# Patient Record
Sex: Female | Born: 1955 | Race: White | Hispanic: Yes | Marital: Married | Smoking: Former smoker
Health system: Southern US, Community
[De-identification: ages and names within clinical notes are randomized; demographics above are authoritative.]

## PROBLEM LIST (undated history)

## (undated) ENCOUNTER — Ambulatory Visit (HOSPITAL_COMMUNITY): Admission: EM | Payer: No Typology Code available for payment source | Source: Home / Self Care

## (undated) DIAGNOSIS — E7800 Pure hypercholesterolemia, unspecified: Secondary | ICD-10-CM

## (undated) DIAGNOSIS — T7840XA Allergy, unspecified, initial encounter: Secondary | ICD-10-CM

## (undated) DIAGNOSIS — F329 Major depressive disorder, single episode, unspecified: Secondary | ICD-10-CM

## (undated) DIAGNOSIS — G4730 Sleep apnea, unspecified: Secondary | ICD-10-CM

## (undated) DIAGNOSIS — J302 Other seasonal allergic rhinitis: Secondary | ICD-10-CM

## (undated) DIAGNOSIS — F32A Depression, unspecified: Secondary | ICD-10-CM

## (undated) HISTORY — DX: Major depressive disorder, single episode, unspecified: F32.9

## (undated) HISTORY — PX: REDUCTION MAMMAPLASTY: SUR839

## (undated) HISTORY — DX: Allergy, unspecified, initial encounter: T78.40XA

## (undated) HISTORY — DX: Other seasonal allergic rhinitis: J30.2

## (undated) HISTORY — DX: Depression, unspecified: F32.A

## (undated) HISTORY — PX: OTHER SURGICAL HISTORY: SHX169

## (undated) HISTORY — DX: Pure hypercholesterolemia, unspecified: E78.00

## (undated) HISTORY — DX: Sleep apnea, unspecified: G47.30

---

## 2003-04-17 ENCOUNTER — Ambulatory Visit (HOSPITAL_COMMUNITY): Admission: RE | Admit: 2003-04-17 | Discharge: 2003-04-17 | Payer: Self-pay | Admitting: Internal Medicine

## 2003-06-25 ENCOUNTER — Emergency Department (HOSPITAL_COMMUNITY): Admission: AD | Admit: 2003-06-25 | Discharge: 2003-06-25 | Payer: Self-pay | Admitting: Emergency Medicine

## 2003-06-25 ENCOUNTER — Encounter: Payer: Self-pay | Admitting: Emergency Medicine

## 2004-08-26 ENCOUNTER — Ambulatory Visit: Payer: Self-pay | Admitting: Internal Medicine

## 2004-09-01 ENCOUNTER — Ambulatory Visit: Payer: Self-pay | Admitting: Internal Medicine

## 2004-09-02 ENCOUNTER — Encounter: Admission: RE | Admit: 2004-09-02 | Discharge: 2004-09-02 | Payer: Self-pay | Admitting: Internal Medicine

## 2004-09-28 ENCOUNTER — Ambulatory Visit: Payer: Self-pay | Admitting: Internal Medicine

## 2004-09-29 ENCOUNTER — Ambulatory Visit: Payer: Self-pay | Admitting: *Deleted

## 2004-10-06 ENCOUNTER — Encounter: Admission: RE | Admit: 2004-10-06 | Discharge: 2004-10-06 | Payer: Self-pay | Admitting: Family Medicine

## 2004-11-29 ENCOUNTER — Ambulatory Visit: Payer: Self-pay | Admitting: Internal Medicine

## 2004-12-01 ENCOUNTER — Ambulatory Visit (HOSPITAL_COMMUNITY): Admission: RE | Admit: 2004-12-01 | Discharge: 2004-12-01 | Payer: Self-pay | Admitting: Internal Medicine

## 2005-12-19 HISTORY — PX: HAND SURGERY: SHX662

## 2006-04-26 ENCOUNTER — Encounter: Admission: RE | Admit: 2006-04-26 | Discharge: 2006-04-26 | Payer: Self-pay | Admitting: Internal Medicine

## 2006-09-07 ENCOUNTER — Other Ambulatory Visit: Admission: RE | Admit: 2006-09-07 | Discharge: 2006-09-07 | Payer: Self-pay | Admitting: Obstetrics and Gynecology

## 2006-09-14 ENCOUNTER — Encounter: Admission: RE | Admit: 2006-09-14 | Discharge: 2006-09-14 | Payer: Self-pay | Admitting: Obstetrics and Gynecology

## 2007-03-05 ENCOUNTER — Ambulatory Visit: Payer: Self-pay | Admitting: Internal Medicine

## 2007-05-03 ENCOUNTER — Encounter: Admission: RE | Admit: 2007-05-03 | Discharge: 2007-05-03 | Payer: Self-pay | Admitting: Family Medicine

## 2007-05-07 ENCOUNTER — Ambulatory Visit: Payer: Self-pay | Admitting: Internal Medicine

## 2007-09-05 ENCOUNTER — Encounter (INDEPENDENT_AMBULATORY_CARE_PROVIDER_SITE_OTHER): Payer: Self-pay | Admitting: *Deleted

## 2007-09-06 ENCOUNTER — Ambulatory Visit: Payer: Self-pay | Admitting: Internal Medicine

## 2007-09-11 ENCOUNTER — Ambulatory Visit: Payer: Self-pay | Admitting: Internal Medicine

## 2007-09-18 ENCOUNTER — Ambulatory Visit: Payer: Self-pay | Admitting: Internal Medicine

## 2007-09-25 ENCOUNTER — Ambulatory Visit: Payer: Self-pay | Admitting: Family Medicine

## 2007-09-27 ENCOUNTER — Ambulatory Visit: Payer: Self-pay | Admitting: Internal Medicine

## 2007-09-27 LAB — CONVERTED CEMR LAB
Basophils Absolute: 0 10*3/uL (ref 0.0–0.1)
Basophils Relative: 0 % (ref 0–1)
Eosinophils Absolute: 0.1 10*3/uL (ref 0.0–0.7)
Eosinophils Relative: 2 % (ref 0–5)
HCT: 39.2 % (ref 36.0–46.0)
Hemoglobin: 12.6 g/dL (ref 12.0–15.0)
Lymphocytes Relative: 41 % (ref 12–46)
Lymphs Abs: 2.2 10*3/uL (ref 0.7–3.3)
MCHC: 32.1 g/dL (ref 30.0–36.0)
MCV: 92.5 fL (ref 78.0–100.0)
Monocytes Absolute: 0.4 10*3/uL (ref 0.2–0.7)
Monocytes Relative: 7 % (ref 3–11)
Neutro Abs: 2.6 10*3/uL (ref 1.7–7.7)
Neutrophils Relative %: 50 % (ref 43–77)
Platelets: 261 10*3/uL (ref 150–400)
RBC: 4.24 M/uL (ref 3.87–5.11)
RDW: 13.9 % (ref 11.5–14.0)
TSH: 0.824 microintl units/mL (ref 0.350–5.50)
WBC: 5.3 10*3/uL (ref 4.0–10.5)

## 2008-02-07 ENCOUNTER — Ambulatory Visit: Payer: Self-pay | Admitting: Internal Medicine

## 2008-03-12 ENCOUNTER — Encounter (INDEPENDENT_AMBULATORY_CARE_PROVIDER_SITE_OTHER): Payer: Self-pay | Admitting: Nurse Practitioner

## 2008-03-12 ENCOUNTER — Ambulatory Visit: Payer: Self-pay | Admitting: Internal Medicine

## 2008-03-17 ENCOUNTER — Ambulatory Visit: Payer: Self-pay | Admitting: Internal Medicine

## 2008-06-06 ENCOUNTER — Encounter: Admission: RE | Admit: 2008-06-06 | Discharge: 2008-06-06 | Payer: Self-pay | Admitting: Internal Medicine

## 2008-06-24 ENCOUNTER — Ambulatory Visit: Payer: Self-pay | Admitting: Internal Medicine

## 2008-10-14 ENCOUNTER — Ambulatory Visit: Payer: Self-pay | Admitting: Internal Medicine

## 2008-12-04 ENCOUNTER — Ambulatory Visit: Payer: Self-pay | Admitting: Internal Medicine

## 2009-04-27 ENCOUNTER — Ambulatory Visit: Payer: Self-pay | Admitting: Internal Medicine

## 2009-07-03 ENCOUNTER — Ambulatory Visit: Payer: Self-pay | Admitting: Internal Medicine

## 2009-07-14 ENCOUNTER — Ambulatory Visit: Payer: Self-pay | Admitting: Internal Medicine

## 2010-02-10 ENCOUNTER — Ambulatory Visit: Payer: Self-pay | Admitting: Internal Medicine

## 2010-02-10 LAB — CONVERTED CEMR LAB
CRP: 0.1 mg/dL (ref <0.6)
Hgb A1c MFr Bld: 5.7 % (ref 4.6–6.1)
TSH: 0.849 microintl units/mL (ref 0.350–4.500)

## 2010-05-05 ENCOUNTER — Ambulatory Visit: Payer: Self-pay | Admitting: Internal Medicine

## 2010-05-05 LAB — CONVERTED CEMR LAB
ALT: 26 units/L (ref 0–35)
AST: 23 units/L (ref 0–37)
Albumin: 4.3 g/dL (ref 3.5–5.2)
Alkaline Phosphatase: 58 units/L (ref 39–117)
BUN: 11 mg/dL (ref 6–23)
CO2: 23 meq/L (ref 19–32)
Calcium: 9 mg/dL (ref 8.4–10.5)
Chloride: 105 meq/L (ref 96–112)
Creatinine, Ser: 0.73 mg/dL (ref 0.40–1.20)
Glucose, Bld: 101 mg/dL — ABNORMAL HIGH (ref 70–99)
Potassium: 4.1 meq/L (ref 3.5–5.3)
Sodium: 137 meq/L (ref 135–145)
Total Bilirubin: 0.2 mg/dL — ABNORMAL LOW (ref 0.3–1.2)
Total CK: 222 units/L — ABNORMAL HIGH (ref 7–177)
Total Protein: 7.1 g/dL (ref 6.0–8.3)

## 2010-07-12 ENCOUNTER — Ambulatory Visit: Payer: Self-pay | Admitting: Internal Medicine

## 2010-08-02 ENCOUNTER — Ambulatory Visit: Payer: Self-pay | Admitting: Internal Medicine

## 2010-08-02 LAB — CONVERTED CEMR LAB
BUN: 16 mg/dL (ref 6–23)
CO2: 26 meq/L (ref 19–32)
Calcium: 9.8 mg/dL (ref 8.4–10.5)
Chloride: 106 meq/L (ref 96–112)
Creatinine, Ser: 0.79 mg/dL (ref 0.40–1.20)
Glucose, Bld: 96 mg/dL (ref 70–99)
Hgb A1c MFr Bld: 5.7 % — ABNORMAL HIGH (ref <5.7)
Potassium: 4.7 meq/L (ref 3.5–5.3)
Sodium: 138 meq/L (ref 135–145)

## 2010-08-24 ENCOUNTER — Emergency Department (HOSPITAL_COMMUNITY): Admission: EM | Admit: 2010-08-24 | Discharge: 2010-08-24 | Payer: Self-pay | Admitting: Family Medicine

## 2010-09-10 ENCOUNTER — Encounter: Admission: RE | Admit: 2010-09-10 | Discharge: 2010-09-10 | Payer: Self-pay | Admitting: Family Medicine

## 2010-09-22 ENCOUNTER — Encounter (INDEPENDENT_AMBULATORY_CARE_PROVIDER_SITE_OTHER): Payer: Self-pay | Admitting: Internal Medicine

## 2010-09-22 LAB — CONVERTED CEMR LAB
Cholesterol: 191 mg/dL (ref 0–200)
HDL: 43 mg/dL (ref >39)
LDL Cholesterol: 110 mg/dL — ABNORMAL HIGH (ref 0–99)
Total CHOL/HDL Ratio: 4.4
Triglycerides: 192 mg/dL — ABNORMAL HIGH (ref <150)
VLDL: 38 mg/dL (ref 0–40)

## 2010-09-23 ENCOUNTER — Ambulatory Visit (HOSPITAL_COMMUNITY): Admission: RE | Admit: 2010-09-23 | Discharge: 2010-09-23 | Payer: Self-pay | Admitting: Family Medicine

## 2010-11-17 ENCOUNTER — Emergency Department (HOSPITAL_COMMUNITY)
Admission: EM | Admit: 2010-11-17 | Discharge: 2010-11-17 | Payer: Self-pay | Source: Home / Self Care | Admitting: Family Medicine

## 2011-03-21 ENCOUNTER — Inpatient Hospital Stay (INDEPENDENT_AMBULATORY_CARE_PROVIDER_SITE_OTHER)
Admission: RE | Admit: 2011-03-21 | Discharge: 2011-03-21 | Disposition: A | Discharge status: Home / Self Care | Payer: Self-pay | Source: Ambulatory Visit | Attending: Emergency Medicine | Admitting: Emergency Medicine

## 2011-03-21 DIAGNOSIS — R51 Headache: Secondary | ICD-10-CM

## 2011-03-21 DIAGNOSIS — L0292 Furuncle, unspecified: Secondary | ICD-10-CM

## 2011-04-19 ENCOUNTER — Inpatient Hospital Stay (HOSPITAL_COMMUNITY): Admission: RE | Admit: 2011-04-19 | Payer: Medicaid Other | Source: Ambulatory Visit

## 2012-12-05 ENCOUNTER — Other Ambulatory Visit: Payer: Self-pay | Admitting: Family Medicine

## 2012-12-05 DIAGNOSIS — Z1231 Encounter for screening mammogram for malignant neoplasm of breast: Secondary | ICD-10-CM

## 2012-12-06 ENCOUNTER — Ambulatory Visit
Admission: RE | Admit: 2012-12-06 | Discharge: 2012-12-06 | Disposition: A | Discharge status: Home / Self Care | Payer: Medicare Other | Source: Ambulatory Visit | Attending: Family Medicine | Admitting: Family Medicine

## 2012-12-06 DIAGNOSIS — Z1231 Encounter for screening mammogram for malignant neoplasm of breast: Secondary | ICD-10-CM

## 2015-04-15 ENCOUNTER — Ambulatory Visit (INDEPENDENT_AMBULATORY_CARE_PROVIDER_SITE_OTHER): Payer: Medicare Other | Admitting: Neurology

## 2015-04-15 ENCOUNTER — Encounter: Payer: Self-pay | Admitting: Neurology

## 2015-04-15 VITALS — BP 117/74 | HR 63 | Temp 97.3°F | Ht 61.0 in | Wt 214.0 lb

## 2015-04-15 DIAGNOSIS — R519 Headache, unspecified: Secondary | ICD-10-CM | POA: Insufficient documentation

## 2015-04-15 DIAGNOSIS — E662 Morbid (severe) obesity with alveolar hypoventilation: Secondary | ICD-10-CM | POA: Diagnosis not present

## 2015-04-15 DIAGNOSIS — R0683 Snoring: Secondary | ICD-10-CM | POA: Diagnosis not present

## 2015-04-15 DIAGNOSIS — R0681 Apnea, not elsewhere classified: Secondary | ICD-10-CM | POA: Diagnosis not present

## 2015-04-15 DIAGNOSIS — G4719 Other hypersomnia: Secondary | ICD-10-CM | POA: Diagnosis not present

## 2015-04-15 DIAGNOSIS — R51 Headache with orthostatic component, not elsewhere classified: Secondary | ICD-10-CM

## 2015-04-15 NOTE — Progress Notes (Signed)
[facility]    Provider:  Dr Jaynee Eagles Referring Provider: Harvie Junior, MD Primary Care Physician:  No primary care provider on file.  CC:  Headache  HPI:  Megan Mcintyre is a 59 y.o. female here as a referral from Dr. Jimmye Norman for headache   She has headaches, since childhood. The headache is pressure all over the head. Sometimes more on the right side. No nausea, no vomiting. No migrainous symptoms. She snores, she stops breathing in the middle of the night. She has gained 50 pounds. Excessively tired during the day. Falling asleep during the day, no energy. Morning headaches. No changes in vision or hearing. She has a cataract in the right eye. Has some dizziness. Headaches 2-3 days a week. Get 10/10 pain. Takes tylenol, takes advil. Morning headaches. Worsening headaches, can last for days. Can be 10/10 pain. No othe focal neurologic symptoms associated.   Review of Systems: Patient complains of symptoms per HPI as well as the following symptoms: No SOB, No CP, fatigue, snoring, not enough sleep Pertinent negatives per HPI. All others negative.   History   Social History  . Marital Status: Single    Spouse Name: N/A  . Number of Children: 3  . Years of Education: 12   Occupational History  . Unemployed    Social History Main Topics  . Smoking status: Former Research scientist (life sciences)  . Smokeless tobacco: Not on file  . Alcohol Use: No  . Drug Use: No  . Sexual Activity: Not on file   Other Topics Concern  . Not on file   Social History Narrative   Lives at home by herself.      Caffeine: none    Family History  Problem Relation Age of Onset  . Migraines Neg Hx     Past Medical History  Diagnosis Date  . High cholesterol   . Seasonal allergies     Past Surgical History  Procedure Laterality Date  . Hand surgery Bilateral 2007    Carpal tunnel     Current Outpatient Prescriptions  Medication Sig Dispense Refill  . lovastatin (MEVACOR) 10 MG  tablet Take 10 mg by mouth daily.     No current facility-administered medications for this visit.    Allergies as of 04/15/2015  . (No Known Allergies)    Vitals: BP 117/74 mmHg  Pulse 63  Temp(Src) 97.3 F (36.3 C)  Ht 5\' 1"  (1.549 m)  Wt 214 lb (97.07 kg)  BMI 40.46 kg/m2 Last Weight:  Wt Readings from Last 1 Encounters:  04/15/15 214 lb (97.07 kg)   Last Height:   Ht Readings from Last 1 Encounters:  04/15/15 5\' 1"  (1.549 m)   Physical exam: Exam: Gen: NAD, conversant, well nourised, obese, well groomed                     CV: RRR, no MRG. No Carotid Bruits. No peripheral edema, warm, nontender Eyes: Conjunctivae clear without exudates or hemorrhage  Neuro: Detailed Neurologic Exam  Speech:    Speech is normal; fluent and spontaneous with normal comprehension.  Cognition:    The patient is oriented to person, place, and time;     recent and remote memory intact;     language fluent;     normal attention, concentration,     fund of knowledge Cranial Nerves:    The pupils are equal, round, and reactive to light. The fundi are normal and spontaneous venous pulsations are present. Visual  fields are full to finger confrontation. Extraocular movements are intact. Trigeminal sensation is intact and the muscles of mastication are normal. The face is symmetric. The palate elevates in the midline. Hearing intact. Voice is normal. Shoulder shrug is normal. The tongue has normal motion without fasciculations.   Coordination:    Normal finger to nose and heel to shin. Normal rapid alternating movements.   Gait:    Heel-toe and tandem gait are normal.   Motor Observation:    No asymmetry, no atrophy, and no involuntary movements noted. Tone:    Normal muscle tone.    Posture:    Posture is normal. normal erect    Strength:    Strength is V/V in the upper and lower limbs.      Sensation: intact to LT     Reflex Exam:  DTR's:    Deep tendon reflexes in the upper  and lower extremities are normal bilaterally.   Toes:    The toes are downgoing bilaterally.   Clonus:    Clonus is absent.    Assessment/Plan:  59 year old female with 60 pound weight gain, morning headaches, excessive daytime sleepiness, witnessed apneic events, snoring, morbid obesity. Likely untreated obstructive sleep apnea versus obesity hypoventilation syndrome or both. Explained to patient that medication is unlikely to help headaches if the cause of obstructive sleep apnea. In addition untreated obstructive sleep apnea is a risk factor for stroke hypertension and pulmonary hypertension. Neuro exam normal including funduscopic exam. No suggestion of idiopathic intracranial hypertension. We'll order a CT of the head. We'll order a CMP. Evaluate for OSA and obesity hypoventilation syndrome. Likely causing headaches. Will order sleep study.    Sarina Ill, MD  [facility] [address] [place], [place] [address]  Phone [phone] Fax [phone]

## 2015-04-15 NOTE — Patient Instructions (Signed)
Overall you are doing fairly well but I do want to suggest a few things today:   Remember to drink plenty of fluid, eat healthy meals and do not skip any meals. Try to eat protein with a every meal and eat a healthy snack such as fruit or nuts in between meals. Try to keep a regular sleep-wake schedule and try to exercise daily, particularly in the form of walking, 20-30 minutes a day, if you can.   As far as your medications are concerned, I would like to suggest: none at this time  As far as diagnostic testing: CT of the head, sleep study  I would like to see you back after sleep study, sooner if we need to. Please call us with any interim questions, concerns, problems, updates or refill requests.   Please also call us for any test results so we can go over those with you on the phone.  My clinical assistant and will answer any of your questions and relay your messages to me and also relay most of my messages to you.   Our phone number is [phone]. We also have an after hours call service for urgent matters and there is a physician on-call for urgent questions. For any emergencies you know to call 911 or go to the nearest emergency room

## 2015-04-22 ENCOUNTER — Ambulatory Visit
Admission: RE | Admit: 2015-04-22 | Discharge: 2015-04-22 | Disposition: A | Discharge status: Home / Self Care | Payer: Medicare Other | Source: Ambulatory Visit | Attending: Neurology | Admitting: Neurology

## 2015-04-22 DIAGNOSIS — R51 Headache with orthostatic component, not elsewhere classified: Secondary | ICD-10-CM

## 2015-04-22 DIAGNOSIS — R519 Headache, unspecified: Secondary | ICD-10-CM

## 2015-04-23 ENCOUNTER — Telehealth: Payer: Self-pay | Admitting: *Deleted

## 2015-04-23 NOTE — Telephone Encounter (Signed)
Spoke with patient about normal CT head results. PT verbalized understanding. Pt also wondering about referral for sleep study. I told her referral was sent to [facility] on 04/15/15. I told her I would check into it and make sure they received the referral and call her back if there are any issues. Pt verbalized understanding.

## 2015-04-23 NOTE — Telephone Encounter (Signed)
Spoke with Durwin Glaze and she said she will contact patient to schedule sleep study.

## 2015-04-28 ENCOUNTER — Encounter: Payer: Self-pay | Admitting: Neurology

## 2015-04-28 ENCOUNTER — Ambulatory Visit (INDEPENDENT_AMBULATORY_CARE_PROVIDER_SITE_OTHER): Payer: Medicare Other | Admitting: Neurology

## 2015-04-28 VITALS — BP 122/80 | HR 72 | Resp 16 | Ht 61.0 in | Wt 210.0 lb

## 2015-04-28 DIAGNOSIS — R519 Headache, unspecified: Secondary | ICD-10-CM

## 2015-04-28 DIAGNOSIS — G4733 Obstructive sleep apnea (adult) (pediatric): Secondary | ICD-10-CM

## 2015-04-28 DIAGNOSIS — R51 Headache: Secondary | ICD-10-CM | POA: Diagnosis not present

## 2015-04-28 DIAGNOSIS — E669 Obesity, unspecified: Secondary | ICD-10-CM | POA: Diagnosis not present

## 2015-04-28 NOTE — Progress Notes (Signed)
Subjective:    Patient ID: Megan Mcintyre is a 59 y.o. female.  HPI     Megan Age, MD, PhD [facility] [address] [place], [place] [address]  Dear Megan Mcintyre,   I saw your patient, Megan Mcintyre, upon your kind request in my clinic today for initial consultation of her sleep disorder, in particular, concern for underlying obstructive sleep apnea. The patient is unaccompanied today. As you know, Megan Mcintyre is a 59 year old right-handed woman with an underlying medical history of allergies, PUD, hyperlipidemia, obesity and recurrent headaches, who reports snoring, witnessed breathing pauses while asleep (per husband, who stays in the [place] sleep disruption, morning headaches, and excessive daytime somnolence.   She had a CTH wo contrast on 04/22/15, which was reported as normal.  I personally reviewed the images through the PACS system.  She does not have a set bed time or wake time routine. She says that no matter how early she may go to bed she never wakes up rested. Often she will stay in bed after waking up because she does not feel like getting out of bed. She has some depressive symptoms but is not on any medication for this. She does not like to take medications. She has a history of gastric ulcer but is currently not on an anti-acid medication. She is quite frustrated. She just wants to feel better. She has 3 grown children. She lives alone. She does not drink any alcohol, she quit smoking about 10 years ago and does not drink any sodas. She has occasional aching in her legs. She has occasional cramps in her legs. She's not sure if she kicks her twitches in her sleep. She does not typically get up to use the bathroom at night. She often wakes up with a headache which is a global aching sensation.  She snores and this can be allowed per husband. He has noted breathing pauses while she is asleep. She wakes up with a sense of  gasping and panic. She has had shortness of breath after climbing one flight of stairs. She has left knee pain.   Her Past Medical History Is Significant For: Past Medical History  Diagnosis Date  . High cholesterol   . Seasonal allergies     Her Past Surgical History Is Significant For: Past Surgical History  Procedure Laterality Date  . Hand surgery Bilateral 2007    Carpal tunnel     Her Family History Is Significant For: Family History  Problem Relation Mcintyre of Onset  . Migraines Neg Hx   . Heart disease Mother   . Stroke Father   . Diabetes Father     Her Social History Is Significant For: History   Social History  . Marital Status: Single    Spouse Name: N/A  . Number of Children: 3  . Years of Education: 12   Occupational History  . Unemployed    Social History Main Topics  . Smoking status: Former Research scientist (life sciences)  . Smokeless tobacco: Not on file     Comment: Quit in 2006  . Alcohol Use: No  . Drug Use: No  . Sexual Activity: Not on file   Other Topics Concern  . None   Social History Narrative   Lives at home by herself.      Caffeine: none    Her Allergies Are:  No Known Allergies:   Her Current Medications Are:  Outpatient Encounter Prescriptions as of 04/28/2015  Medication  Sig  . lovastatin (MEVACOR) 10 MG tablet Take 10 mg by mouth daily.   No facility-administered encounter medications on file as of 04/28/2015.  :  Review of Systems:  Out of a complete 14 point review of systems, all are reviewed and negative with the exception of these symptoms as listed below:   Review of Systems  Constitutional: Positive for fatigue.       Weight gain   Eyes: Positive for pain.  Respiratory: Positive for shortness of breath.   Cardiovascular: Positive for chest pain, palpitations and leg swelling.  Musculoskeletal:       Back pain, joint pain, aching muscles   Neurological: Positive for headaches.       Memory loss, reports trouble falling asleep at  night, snore, wakes up tired, daytime tiredness, witnessed apnea and movement during sleep, denies taking naps during the day.   Psychiatric/Behavioral:       Depression and anxiety, decreased energy    Objective:  Neurologic Exam  Physical Exam Physical Examination:   Filed Vitals:   04/28/15 0817  BP: 122/80  Pulse: 72  Resp: 16    General Examination: The patient is a very pleasant 59 y.o. female in no acute distress. She appears well-developed and well-nourished and well groomed. She is obese.  HEENT: Normocephalic, atraumatic, pupils are equal, round and reactive to light and accommodation. Funduscopic exam is normal with sharp disc margins noted. Extraocular tracking is good without limitation to gaze excursion or nystagmus noted. Normal smooth pursuit is noted. Hearing is grossly intact. Tympanic membranes are clear bilaterally. Face is symmetric with normal facial animation and normal facial sensation. Speech is clear with no dysarthria noted. There is no hypophonia. There is no lip, neck/head, jaw or voice tremor. Neck is supple with full range of passive and active motion. There are no carotid bruits on auscultation. Oropharynx exam reveals: mild mouth dryness, adequate dental hygiene and marked airway crowding, due to narrow airway entry, thick soft palate and large uvula, larger tongue and larger tonsils of about 3+ bilaterally. Neck size is 16 inches. She does not have an overbite. Nasal inspection reveals no significant nasal mucosal bogginess or redness and no septal deviation.   Chest: Clear to auscultation without wheezing, rhonchi or crackles noted.  Heart: S1+S2+0, regular and normal without murmurs, rubs or gallops noted.   Abdomen: Soft, non-tender and non-distended with normal bowel sounds appreciated on auscultation.  Extremities: There is no pitting edema in the distal lower extremities bilaterally. Pedal pulses are intact.  Skin: Warm and dry without trophic  changes noted. There are no varicose veins.  Musculoskeletal: exam reveals no obvious joint deformities, tenderness or joint swelling or erythema.   Neurologically:  Mental status: The patient is awake, alert and oriented in all 4 spheres. Her immediate and remote memory, attention, language skills and fund of knowledge are appropriate. There is no evidence of aphasia, agnosia, apraxia or anomia. Speech is clear with normal prosody and enunciation. Thought process is linear. Mood is normal and affect is increased in intensity.  Cranial nerves II - XII are as described above under HEENT exam. In addition: shoulder shrug is normal with equal shoulder height noted. Motor exam: Normal bulk, strength and tone is noted. There is no drift, tremor or rebound. Romberg is negative. Reflexes are 2+ throughout. Babinski: Toes are flexor bilaterally. Fine motor skills and coordination: intact with normal finger taps, normal hand movements, normal rapid alternating patting, normal foot taps and normal foot agility.  Cerebellar testing: No dysmetria or intention tremor on finger to nose testing. Heel to shin is unremarkable bilaterally. There is no truncal or gait ataxia.  Sensory exam: intact to light touch, pinprick, vibration, temperature sense the upper and lower extremities.  Gait, station and balance: She stands easily. No veering to one side is noted. No leaning to one side is noted. Posture is Mcintyre-appropriate and stance is narrow based. Gait shows normal stride length and normal pace. No problems turning are noted. She turns en bloc. Tandem walk is unremarkable.   Assessment and Plan:   In summary, Megan Mcintyre is a very pleasant 59 y.o.-year old female with an underlying medical history of allergies, PUD, hyperlipidemia, obesity and recurrent headaches, whose history and physical exam are indeed in keeping with obstructive sleep apnea (OSA). I had a long chat with the patient about my findings and the  diagnosis of OSA, its prognosis and treatment options. We talked about medical treatments, surgical interventions and non-pharmacological approaches. I explained in particular the risks and ramifications of untreated moderate to severe OSA, especially with respect to developing cardiovascular disease down the Road, including congestive heart failure, difficult to treat hypertension, cardiac arrhythmias, or stroke. Even type 2 diabetes has, in part, been linked to untreated OSA. Symptoms of untreated OSA include daytime sleepiness, memory problems, mood irritability and mood disorder such as depression and anxiety, lack of energy, as well as recurrent headaches, especially morning headaches. We talked about trying to maintain a healthy lifestyle in general, as well as the importance of weight control. I encouraged the patient to eat healthy, exercise daily and keep well hydrated, to keep a scheduled bedtime and wake time routine, to not skip any meals and eat healthy snacks in between meals. I advised the patient not to drive when feeling sleepy. I recommended the following at this time: sleep study with potential positive airway pressure titration. (We will score hypopneas at 4% and split the sleep study into diagnostic and treatment portion, if the estimated. 2 hour AHI is >15/h).   I explained the sleep test procedure to the patient and also outlined possible surgical and non-surgical treatment options of OSA, including the use of a custom-made dental device (which would require a referral to a specialist dentist or oral surgeon), upper airway surgical options, such as pillar implants, radiofrequency surgery, tongue base surgery, and UPPP (which would involve a referral to an ENT surgeon). Rarely, jaw surgery such as mandibular advancement may be considered.  I also explained the CPAP treatment option to the patient, who indicated that she would be willing to try CPAP if the need arises. I explained the  importance of being compliant with PAP treatment, not only for insurance purposes but primarily to improve Her symptoms, and for the patient's long term health benefit, including to reduce Her cardiovascular risks. I answered all her questions today and the patient was in agreement. I would like to see her back after the sleep study is completed and encouraged her to call with any interim questions, concerns, problems or updates.   Thank you very much for allowing me to participate in the care of this nice patient. If I can be of any further assistance to you please do not hesitate to call me at [phone].  Sincerely,   Megan Age, MD, PhD

## 2015-04-28 NOTE — Patient Instructions (Addendum)

## 2015-05-03 ENCOUNTER — Ambulatory Visit (INDEPENDENT_AMBULATORY_CARE_PROVIDER_SITE_OTHER): Payer: Medicare Other | Admitting: Neurology

## 2015-05-03 VITALS — Ht 61.0 in | Wt 210.0 lb

## 2015-05-03 DIAGNOSIS — G4733 Obstructive sleep apnea (adult) (pediatric): Secondary | ICD-10-CM | POA: Diagnosis not present

## 2015-05-03 DIAGNOSIS — G479 Sleep disorder, unspecified: Secondary | ICD-10-CM

## 2015-05-04 ENCOUNTER — Telehealth: Payer: Self-pay | Admitting: Neurology

## 2015-05-04 DIAGNOSIS — G4733 Obstructive sleep apnea (adult) (pediatric): Secondary | ICD-10-CM

## 2015-05-04 NOTE — Telephone Encounter (Signed)
Megan Mcintyre: Dr. Cathren Laine patient. I saw her on 04/28/15, sleep study on 05/03/15   Please call and notify patient that the recent sleep study confirmed the diagnosis of severe OSA. She did very well with CPAP during the study with significant improvement of the respiratory events. Therefore, I would like start the patient on CPAP therapy at home by prescribing a machine for home use. I placed the order in the chart. The patient will need a follow up appointment with me in 8 to 10 weeks post set up that has to be scheduled; please go ahead and schedule while you have the patient on the phone and make sure patient understands the importance of keeping this window for the FU appointment, as it is often an insurance requirement and failing to adhere to this may result in losing coverage for sleep apnea treatment. 15 min follow-up should suffice, unless there is a 30 min FU slot available.  Please re-enforce the importance of compliance with treatment and the need for [place] to monitor compliance data - again an insurance requirement and good feedback for the patient as far as how they are doing.  Also remind patient, that any upcoming CPAP machine or mask issues, should be first addressed with the DME company. Please ask if patient has a preference regarding DME company.  Please arrange for CPAP set up at home through a DME company of patient's choice - once you have spoken to the patient - and faxed/routed report to PCP and referring MD (if other than PCP), you can close this encounter, thanks,   Star Age, MD, PhD [facility] ([place])

## 2015-05-04 NOTE — Telephone Encounter (Signed)
Pt called office to request that she be contacted in reference to receiving cpap machine by the end of the week because she will be out of town for a few weeks. Pt has not been feeling well and not getting sleep. Please contact pt when request is reviewed.

## 2015-05-05 ENCOUNTER — Encounter (HOSPITAL_COMMUNITY): Payer: Self-pay | Admitting: Emergency Medicine

## 2015-05-05 ENCOUNTER — Emergency Department (INDEPENDENT_AMBULATORY_CARE_PROVIDER_SITE_OTHER): Payer: Medicare Other

## 2015-05-05 ENCOUNTER — Emergency Department (INDEPENDENT_AMBULATORY_CARE_PROVIDER_SITE_OTHER)
Admission: EM | Admit: 2015-05-05 | Discharge: 2015-05-05 | Disposition: A | Discharge status: Home / Self Care | Payer: Medicare Other | Source: Home / Self Care | Attending: Family Medicine | Admitting: Family Medicine

## 2015-05-05 DIAGNOSIS — K120 Recurrent oral aphthae: Secondary | ICD-10-CM

## 2015-05-05 DIAGNOSIS — M25562 Pain in left knee: Secondary | ICD-10-CM

## 2015-05-05 MED ORDER — MELOXICAM 7.5 MG PO TABS: 7.5000 mg | ORAL_TABLET | Freq: Every day | ORAL | Status: DC

## 2015-05-05 MED ORDER — TRIAMCINOLONE ACETONIDE 0.1 % MT PSTE: PASTE | OROMUCOSAL | Status: DC

## 2015-05-05 NOTE — Discharge Instructions (Signed)
Medications as directed. Your xrays are without evidence of injury to the bones of your knee. Ice, elevation and pain medication as directed. Follow up with your doctor or the orthopedist listed on your discharge paperwork if symptoms do not improve.  lceras bucales  ([place])  Las lceras bucales son llagas abiertas y Engineer, civil (consulting) en el interior de la boca y la [place]. Pueden ser blancas o amarillas. Las llagas se curan en 1 a 2 semanas. Hay ms probabilidad que las mujeres tengan una recurrencia del trastorno que los hombres. CAUSAS  La causa de este trastorno no se conoce bien. Es probable que exista ms de [place]. La causa no parecen ser los grmenes (virus o bacterias). Algunas causas pueden ser:   Reaccin alrgica a ciertos alimentos.  Problemas digestivos.  No tener la cantidad suficiente de vitamina B 12, cido flico y Sport and exercise psychologist.  Hormonas sexuales femeninas. [place] aparecer slo durante ciertas fases del ciclo menstrual. Generalmente se produce una mejora durante el embarazo.  Factores genticos. En algunas personas las lceras parecen ser heredadas. El estrs emocional y las lesiones en la boca pueden desencadenar un brote, pero no son la causa.  DIAGNSTICO  Las lceras bucales se diagnostican realizando un examen fsico.  TRATAMIENTO   A los pacientes que tienen episodios frecuentes de lceras bucales se les puede indicar cultivos tomados de las llagas, anlisis de [place] o pruebas de Buyer, retail. Esto ayuda a determinar si las llagas estn causadas por una dieta deficiente, alergia u otras causas que puedan prevenirse.  Las vitaminas pueden prevenir recurrencias o reducir la gravedad de las [employer] personas con una nutricin deficiente.  Los ungentos con anestesia pueden Best boy. Estn disponibles en las farmacias sin receta mdica.  El mdico podr recetar un enjuague bucal o un gel con corticoides anti-inflamatorios para calmar los dolores  intensos.  Si ha sufrido lceras bucales graves y recurrentes, le recetarn corticoides por va oral. Estos medicamentos fuertes pueden causar muchos efectos secundarios y deben usarse slo bajo la estricta direccin de un dentista o mdico.  Podrn recetarle un enjuague bucal que contenga antibiticos. Este medicamento podr [employer] sntomas y Scientific laboratory technician curacin. Generalmente, la curacin se produce en alrededor de 1 o 2 semanas con o sin tratamiento. Ciertos enjuagues bucales con antibitico administrados a mujeres embarazadas y nios pequeos pueden mancharles los dientes de [place]. Hable con su mdico acerca de su tratamiento.  INSTRUCCIONES PARA EL CUIDADO EN EL HOGAR   Evite los alimentos que le produzcan lceras bucales.  Evite los jugos ctricos, los alimentos picantes o salados y el caf hasta que se cure.  Utilice un cepillo de dientes de cerdas suaves.  Mastique los alimentos cuidadosamente para evitar morderse la mejilla.  Aplique anestesia tpica en las lceras bucales para Best boy.  Aplique una capa delgada de bicarbonato de sodio y agua ayudar a Therapist, art.  Slo use enjuagues bucales o medicamentos para el dolor o Health and safety inspector segn las indicaciones de su mdico. SOLICITE ATENCIN MDICA SI:   Los sntomas no mejoran despus de 1 semana.  Las lceras bucales no se han curado luego de transcurridas 2 semanas.  Le producen [employer].  Tiene dificultad para respirar o tragar.  Las lceras bucales vuelven a aparecer con frecuencia. Document Released: 08/29/2012 Document Revised: 04/01/2013 [facility] Patient Information 2015 [place]. This information is not intended to replace advice given to you by your health care provider. Make sure you discuss  any questions you have with your health care provider.  Dolor en la rodilla (Knee Pain) La rodilla es la articulacin compleja entre el muslo y la parte inferior de la pierna. En esta  articulacin hay huesos, tendones, ligamentos y Database administrator. Los huesos que forman la rodilla son:  El fmur en el muslo.  La tibia y el peron en la pierna.  La rtula montada en la ranura de la parte inferior del muslo. [place] es una causa frecuente de [place] y puede tener varias causas. Algunas son:  Lesiones como:  Ruptura de ligamento o lesin en el tendn.  Esguince del cartlago  Enfermedades como:  Gota.  Artritis.  Infecciones.  Uso excesivo, demasiado entrenamiento o mucha actividad fsica. El dolor de rodilla puede ser leve o intenso. Puede acompaar una lesin debilitante. Los problemas leves con frecuencia responden bien a tratamientos caseros o se mejoran por s mismas. Las lesiones ms graves pueden requerir la intervencin del mdico y Rennis Petty. SNTOMAS La rodilla es una articulacin compleja. Los sntomas pueden variar ampliamente Algunos son:  Dolor con el movimiento o al soportar peso.  Hinchazn y Social research officer, government.  Torsin de la rodilla.  Imposibilidad para estirar la rodilla.  La rodilla se traba y no puede enderezarla.  Siente calor y se observa enrojecimiento con dolor y [place].  Deformidad o dislocacin de la rtula. DIAGNSTICO Determinar cual es el problema puede ser bastante simple, como cuando hay una lesin. Tambin puede ser [employer] debido a la complejidad de la rodilla. Las pruebas para Optometrist un diagnstico son:  Shirleen Schirmer y examen fsico por parte del mdico.  Radiografas para descartar otros problemas. Las radiografas no mostrarn la ruptura del Database administrator. Algunas lesiones en la rodilla pueden diagnosticarse del siguiente modo:  La artroscopia es una tcnica quirrgica por la que una pequea cmara de vdeo se inserta en pequeas incisiones que se hacen a los lados de la rodilla. Este procedimiento se [place] para examinar y Psychiatric nurse los problemas de la articulacin interna de la rodilla. Se utilizan  pequeos instrumentos para reparar el cartlago roto (meniscos).  La artrografa es una tcnica radiolgica. Se inyecta un lquido de contraste en la articulacin de la rodilla. Las estructuras internas de la articulacin de la rodilla se hacen visibles en una pelcula de rayos X.  Las imgenes por resonancia magntica son un procedimiento en el que los campos magnticos y una computadora producen imgenes en dos o tres dimensiones del interior de la rodilla. La ruptura del cartlago es visible con esta tcnica. La resonancia magntica ha reemplazado a la artrografa en el diagnstico de la ruptura del cartlago de la rodilla.  Anlisis de [place].  Examen del lquido que lubrica la articulacin de la rodilla (lquido sinovial). Se realiza tomando [place] con [place]. TRATAMIENTO El tratamiento de los problemas de la rodilla depende fundamentalmente de la causa. Algunos de estos tratamientos son:  Segn sea la lesin, un yeso o entablillado, ciruga o fisioterapia.  Permtase el tiempo adecuado de recuperacin. No use demasiado su extremidad lesionada. Si siente dolor durante los ejercicios de rutina, suspndalos. Hgalos ms lentos o realice menos repeticiones.  En el caso de actividades repetitivas como andar en bicicleta o correr, mantenga la fuerza y Ardelia Mems buena nutricin.  Alterne los grupos musculares. Por ejemplo, si levanta pesas, trabaje la parte superior del cuerpo Optician, dispensing, y la parte inferior al da siguiente.  Ni los msculos firmes ni los dbiles proporcionan un sostn  adecuado a la rodilla. Los msculos no absorben el estrs que se ejerce sobre la articulacin de la rodilla. Mantenga fuertes los msculos que rodean a la rodilla.  Cudese de los problemas mecnicos:  Si tiene pie plano, los zapatos ortopdicos o especiales pueden ayudar. Comunquese con el profesional que lo asiste si necesita ayuda adicional.  Los soporte de arco con bordes en la zona interna o  interna del taco pueden ayudar. Cambian la presin del lado de la rodilla ms comprometido por la osteoartritis.  Podrn colocarle una ortesis de rodilla para aliviar la presin en la zona ms artrtica de la rodilla.  Si el profesional le ha prescripto muletas, ortesis, un vendaje o hielo, hgalo segn las indicaciones. El acrnimo para este tratamiento es PRICE. Significa proteccin, reposo, hielo, compresin y elevacin.  Los antiinflamatorios no esteroides, pueden ayudar a Best boy. Pero si se toman inmediatamente luego de la lesin, podran aumentar la hinchazn. Tome los corticoides luego de Soil scientist. Suspndalos si tiene problemas estomacales. No los tome si tiene una historia de Aeronautical engineer, Social research officer, government en el estmago o hemorragia intestinal. No lo tome sin la aprobacin del profesional que la asiste si tiene problemas de retencin de lquidos, insuficencia cardaca o problemas renales.  En los casos crnicos, la fisioterapia puede ser de [place].  La glucosamina y el condroitin son suplementos dietarios de Radio broadcast assistant. Ambos pueden Best boy de la osteoartritis de la rodilla. Estos medicamentos son diferentes de los antiinflamatorios habituales. La glucosamina puede disminuir el porcentaje de destruccin del cartlago.  Las inyecciones de corticoides en la articulacin de la rodilla reducen los sntomas de un brote de artritis. Ofrecen alivio que dura algunos meses. Hay que esperar algunos meses entre la aplicacin de inyecciones. Las inyecciones tiene un pequeo riesgo de infeccin, retencin de lquidos y Agricultural consultant de los niveles de Museum/gallery exhibitions officer.  El cido hialurnico inyectado en las articulaciones lesionadas puede aliviar el dolor y proporciona lubricacin. Estas inyecciones funcionan bien reduciendo la inflamacin. Una serie de inyecciones puede proporcionar alivio durante seis meses.  [place]. Aplicar ciertos ungentos sobre la piel puede ayudar a Best boy y  la rigidez de la osteoartritis. Consulte con el farmacutico, si es necesario. Muchos medicamentos de venta libre estn aprobados para el alivio temporario del dolor artrtico.  En algunos pases los mdicos prescriben antiinflamatorios no esteroides para el alivio de los trastornos crnicos como la artritis y la tendinitis. Un estudio del tratamiento con antiinflamatorios no esteroides aplicados en crema, demostr que funcionaban bien, as como administrados por va oral, pero sin el peligro de los [place]. PREVENCIN  Mantenga un peso normal. Los kilos de ms agregan tensin a las articulaciones.  Mantngase fuerte y gil. Los msculos dbiles son Ardelia Mems causa frecuente de lesiones en la rodilla. La elongacin es importante. Incluya ejercicios de flexibilidad en sus rutinas.  Practique actividad fsica con inteligencia. Si sufre osteoartritis, dolor crnico en la rodilla o lesiones recurrentes, podr ser necesario que modifique el modo en que se ejercita. No significa que deba volverse inactivo. Si le duelen las rodillas despus de correr o jugar basketball, considere la prctica de la natacin, ejercicios aerbicos en el agua u otras actividades de bajo [place], al menos durante algunos das o [employer]. En algunos casos, el [employer] actividades de alto impacto ofrece [place].  Asegrese que sus zapatos le [place]. Elija el calzado deportivo adecuado para su deporte.  Proteja sus rodillas. Use la proteccin adecuada para las actividades  que puedan afectar a sus rodillas. Use rodilleras cuando juegue al vley o se arrodille. Colquese el cinturn de seguridad cada vez que conduzca. La mayor parte de las fracturas de rtula ocurren en accidentes automvilsticos.  Descanse cuando se sienta cansado. SOLICITE ATENCIN MDICA SI: Tiene dolor en la rodilla que es continuo y no parece mejorar.  SOLICITE ATENCIN MDICA DE INMEDIATO SI:  La articulacin de la rodilla se siente  caliente al tacto y usted tiene fiebre. EST SEGURO QUE:   Comprende las instrucciones para el alta mdica.  Controlar su enfermedad.  Solicitar atencin mdica de inmediato segn las indicaciones. Document Released: 05/23/2008 Document Revised: 02/27/2012 [facility] Patient Information 2015 [place]. This information is not intended to replace advice given to you by your health care provider. Make sure you discuss any questions you have with your health care provider.

## 2015-05-05 NOTE — Telephone Encounter (Signed)
Duplicate message. 

## 2015-05-05 NOTE — ED Provider Notes (Signed)
CSN: [id number]     Arrival date & time 05/05/15  1005 History   First MD Initiated Contact with Patient 05/05/15 1218     Chief Complaint  Patient presents with  . Knee Pain  . Oral Swelling   (Consider location/radiation/quality/duration/timing/severity/associated sxs/prior Treatment) HPI Comments: 5+ weeks of left knee pain s/p mechanical fall.  2 weeks of right lower gumline pain  Patient is a 59 y.o. female presenting with knee pain. The history is provided by the patient.  Knee Pain   Past Medical History  Diagnosis Date  . High cholesterol   . Seasonal allergies    Past Surgical History  Procedure Laterality Date  . Hand surgery Bilateral 2007    Carpal tunnel    Family History  Problem Relation Age of Onset  . Migraines Neg Hx   . Heart disease Mother   . Stroke Father   . Diabetes Father    History  Substance Use Topics  . Smoking status: Former Research scientist (life sciences)  . Smokeless tobacco: Not on file     Comment: Quit in 2006  . Alcohol Use: No   OB History    No data available     Review of Systems  All other systems reviewed and are negative.   Allergies  Review of patient's allergies indicates no known allergies.  Home Medications   Prior to Admission medications   Medication Sig Start Date End Date Taking? Authorizing Provider  lovastatin (MEVACOR) 10 MG tablet Take 10 mg by mouth daily. 04/07/15   Historical Provider, MD  meloxicam (MOBIC) 7.5 MG tablet Take 1 tablet (7.5 mg total) by mouth daily. For knee pain 05/05/15   Lutricia Feil, PA  triamcinolone (KENALOG) 0.1 % paste Apply to affected are BID until healed 05/05/15   Annett Gula H Rahman Ferrall, PA   BP 129/76 mmHg  Pulse 63  Temp(Src) 97.9 F (36.6 C) (Oral)  Resp 16  SpO2 98% Physical Exam  Constitutional: She is oriented to person, place, and time. She appears well-developed and well-nourished. No distress.  HENT:  Head: Normocephalic and atraumatic.  Right Ear: Hearing and external ear  normal.  Left Ear: Hearing and external ear normal.  Nose: Nose normal.  Mouth/Throat: Uvula is midline, oropharynx is clear and moist and mucous membranes are normal. Oral lesions present. No trismus in the jaw. No uvula swelling.    Eyes: Conjunctivae are normal.  Cardiovascular: Normal rate, regular rhythm and normal heart sounds.   Pulmonary/Chest: Effort normal and breath sounds normal.  Musculoskeletal: Normal range of motion.       Left knee: She exhibits normal range of motion, no swelling, no effusion, no ecchymosis, no deformity, no laceration, no erythema, normal alignment, no LCL laxity and no MCL laxity. Tenderness found. Medial joint line tenderness noted. No patellar tendon tenderness noted.       Legs: Neurological: She is alert and oriented to person, place, and time.  Skin: Skin is warm and dry. No rash noted. No erythema.  Psychiatric: She has a normal mood and affect. Her behavior is normal.  Nursing note and vitals reviewed.   ED Course  Procedures (including critical care time) Labs Review Labs Reviewed - No data to display  Imaging Review Dg Knee Complete 4 Views Left  05/05/2015   CLINICAL DATA:  Left knee pain with prior injury approximately 1 month ago. Initial encounter.  EXAM: LEFT KNEE - COMPLETE 4+ VIEW  COMPARISON:  None.  FINDINGS: No fracture, dislocation  or joint effusion is identified. Mild proliferative changes are seen involving the patella. No significant joint space narrowing identified. No bony lesions or destruction seen. Soft tissues are unremarkable.  IMPRESSION: No acute findings.  Mild patellofemoral disease.   Electronically Signed   By: Aletta Edouard M.D.   On: 05/05/2015 13:32     MDM   1. Aphthous ulcer   2. Left knee pain   Triamcinolone in orabase for aphthous ulcer. Dental or PCP follow up if no improvement Mobic for knee discomfort. Ortho follow up if no improvement    Lutricia Feil, [place] 05/05/15 1348

## 2015-05-05 NOTE — ED Notes (Signed)
C/o  Swelling and having pain in the right lower gums.  Hx of tooth extraction in the same area.   Pt also reports falling a month ago and possibly twisting left knee.  Was seen by primary and given a shot.  Pt states that she is still having pain and it is gradually getting worse.  Denies any new injury.   No relief with otc meds.

## 2015-05-05 NOTE — Telephone Encounter (Signed)
I spoke to patient. She is aware of results and is very anxious to start CPAP. I sent referral to [facility] and notified them that she would like to start ASAP. I will fax report to referring doctor and send patient reminder letter about f/u. We were able to make f/u appt.

## 2015-05-13 ENCOUNTER — Encounter: Payer: Self-pay | Admitting: Gastroenterology

## 2015-06-30 ENCOUNTER — Ambulatory Visit: Payer: Self-pay | Admitting: Neurology

## 2015-07-02 ENCOUNTER — Telehealth: Payer: Self-pay

## 2015-07-02 NOTE — Telephone Encounter (Signed)
Pt was a no show for PV. Called patient to reschedule PV. Advised patient Phentermine will need to be stopped 10 days prior to procedure. Explained last dose will be Saturday 07-04-15. Pt said okay.

## 2015-07-07 ENCOUNTER — Ambulatory Visit (AMBULATORY_SURGERY_CENTER): Payer: Self-pay

## 2015-07-07 VITALS — Ht 61.0 in | Wt 212.4 lb

## 2015-07-07 DIAGNOSIS — Z1211 Encounter for screening for malignant neoplasm of colon: Secondary | ICD-10-CM

## 2015-07-07 NOTE — Progress Notes (Signed)
No allergies to eggs or soy No home oxygen No past problems with anesthesia Stopped PHENTERMINE on Friday 7/15th  Has email  Emmi instructions given for colonoscopy

## 2015-07-09 ENCOUNTER — Ambulatory Visit (INDEPENDENT_AMBULATORY_CARE_PROVIDER_SITE_OTHER): Payer: Medicare Other | Admitting: Neurology

## 2015-07-09 ENCOUNTER — Encounter: Payer: Self-pay | Admitting: Neurology

## 2015-07-09 VITALS — BP 105/67 | HR 63 | Resp 16 | Ht 61.0 in | Wt 211.0 lb

## 2015-07-09 DIAGNOSIS — G4733 Obstructive sleep apnea (adult) (pediatric): Secondary | ICD-10-CM

## 2015-07-09 DIAGNOSIS — E669 Obesity, unspecified: Secondary | ICD-10-CM

## 2015-07-09 DIAGNOSIS — Z9989 Dependence on other enabling machines and devices: Principal | ICD-10-CM

## 2015-07-09 NOTE — Patient Instructions (Signed)

## 2015-07-09 NOTE — Progress Notes (Signed)
Subjective:    Patient ID: Megan Mcintyre is a 59 y.o. female.  HPI     Interim history:   Megan Mcintyre is a 59 year old right-handed woman with an underlying medical history of allergies, PUD, hyperlipidemia, obesity and recurrent headaches, who presents for follow-up consultation of her obstructive sleep apnea, now established on CPAP therapy. The patient is unaccompanied today. I first met her on 04/28/2015 at the request of Dr. Jaynee Eagles, at which time the patient reported snoring, witnessed breathing pauses while asleep and morning headaches as well as excessive daytime somnolence. I invited her back for sleep study. She had a split-night sleep study on 05/03/2015 underwent over her test results with her in detail today. Her baseline sleep efficiency was 92.7% with a latency to sleep of 2 minutes and wake after sleep onset of 10.5 minutes with moderate sleep fragmentation noted. She had no significant PLMS, EKG or EEG changes. She had mild to moderate snoring. Her total AHI was 33 per hour. Average oxygen saturation was 93%, nadir was 73% in rem sleep. She was then titrated on CPAP. Sleep efficiency was 95.1%. She had a normal arousal index. Her average oxygen saturation was 94%, nadir was 89%. CPAP was titrated from 5-9 cm, her AHI was reduced to 0 per hour at the final pressure with supine REM sleep achieved. Based on her test results I prescribed CPAP therapy for home use.  Today, 07/09/2015: I reviewed her CPAP compliance data from 06/08/2015 through 07/07/2015 which is a total of 30 days during which time she used her machine every night with percent used days greater than 4 hours at 77%, indicating good compliance with an average usage of 4 hours and 39 minutes, residual AHI borderline at 5.9 per hour, leak acceptable with the 95th percentile at 20.7 L/m and a pressure of 9 cm with EPR of 3.   Today, 07/09/2015: She reports that while she does not like to use the CPAP, she has noted improvement  in her recurrent headaches, her daytime energy level and her sleep quality. She is compliant with treatment but finds the nasal pillows uncomfortable sometimes. At times, after reaching 4 hours of usage she will take the mask off and sleep without it. She is planning an extended trip to the [place] but is planning to take her machine with her.  Previously:   She reports snoring, witnessed breathing pauses while asleep (per husband, who stays in the [place] sleep disruption, morning headaches, and excessive daytime somnolence.    She had a CTH wo contrast on 04/22/15, which was reported as normal.  I personally reviewed the images through the PACS system.  She does not have a set bed time or wake time routine. She says that no matter how early she may go to bed she never wakes up rested. Often she will stay in bed after waking up because she does not feel like getting out of bed. She has some depressive symptoms but is not on any medication for this. She does not like to take medications. She has a history of gastric ulcer but is currently not on an anti-acid medication. She is quite frustrated. She just wants to feel better. She has 3 grown children. She lives alone. She does not drink any alcohol, she quit smoking about 10 years ago and does not drink any sodas. She has occasional aching in her legs. She has occasional cramps in her legs. She's not sure if she kicks her twitches in her  sleep. She does not typically get up to use the bathroom at night. She often wakes up with a headache which is a global aching sensation.  She snores and this can be allowed per husband. He has noted breathing pauses while she is asleep. She wakes up with a sense of gasping and panic. She has had shortness of breath after climbing one flight of stairs. She has left knee pain.   Her Past Medical History Is Significant For: Past Medical History  Diagnosis Date  . High cholesterol   . Seasonal  allergies   . Sleep apnea     uses c-pap    Her Past Surgical History Is Significant For: Past Surgical History  Procedure Laterality Date  . Hand surgery Bilateral 2007    Carpal tunnel     Her Family History Is Significant For: Family History  Problem Relation Age of Onset  . Migraines Neg Hx   . Colon cancer Neg Hx   . Heart disease Mother   . Stroke Father   . Diabetes Father     Her Social History Is Significant For: History   Social History  . Marital Status: Single    Spouse Name: N/A  . Number of Children: 3  . Years of Education: 12   Occupational History  . Unemployed    Social History Main Topics  . Smoking status: Former Research scientist (life sciences)  . Smokeless tobacco: Never Used     Comment: Quit in 2006  . Alcohol Use: No  . Drug Use: No  . Sexual Activity: Not on file   Other Topics Concern  . None   Social History Narrative   Lives at home by herself.      Caffeine: none    Her Allergies Are:  No Known Allergies:   Her Current Medications Are:  No outpatient encounter prescriptions on file as of 07/09/2015.   No facility-administered encounter medications on file as of 07/09/2015.  :  Review of Systems:  Out of a complete 14 point review of systems, all are reviewed and negative with the exception of these symptoms as listed below:   Review of Systems  All other systems reviewed and are negative.   Objective:  Neurologic Exam  Physical Exam Physical Examination:   Filed Vitals:   07/09/15 1408  BP: 105/67  Pulse: 63  Resp: 16   General Examination: The patient is a very pleasant 59 y.o. female in no acute distress. She appears well-developed and well-nourished and well groomed. She is obese. She is in good spirits today.  HEENT: Normocephalic, atraumatic, pupils are equal, round and reactive to light and accommodation. Funduscopic exam is normal with sharp disc margins noted. Extraocular tracking is good without limitation to gaze excursion  or nystagmus noted. Normal smooth pursuit is noted. Hearing is grossly intact. Face is symmetric with normal facial animation and normal facial sensation. Speech is clear with no dysarthria noted. There is no hypophonia. There is no lip, neck/head, jaw or voice tremor. Neck is supple with full range of passive and active motion. There are no carotid bruits on auscultation. Oropharynx exam reveals: mild mouth dryness, adequate dental hygiene and marked airway crowding, due to narrow airway entry, thick soft palate and large uvula, larger tongue and larger tonsils of about 3+ bilaterally. She does not have an overbite.   Chest: Clear to auscultation without wheezing, rhonchi or crackles noted.  Heart: S1+S2+0, regular and normal without murmurs, rubs or gallops noted.   Abdomen:  Soft, non-tender and non-distended with normal bowel sounds appreciated on auscultation.  Extremities: There is no pitting edema in the distal lower extremities bilaterally. Pedal pulses are intact.  Skin: Warm and dry without trophic changes noted. There are no varicose veins.  Musculoskeletal: exam reveals no obvious joint deformities, tenderness or joint swelling or erythema.   Neurologically:  Mental status: The patient is awake, alert and oriented in all 4 spheres. Her immediate and remote memory, attention, language skills and fund of knowledge are appropriate. There is no evidence of aphasia, agnosia, apraxia or anomia. Speech is clear with normal prosody and enunciation. Thought process is linear. Mood is normal and affect is increased in intensity.  Cranial nerves II - XII are as described above under HEENT exam. In addition: shoulder shrug is normal with equal shoulder height noted. Motor exam: Normal bulk, strength and tone is noted. There is no drift, tremor or rebound. Romberg is negative. Reflexes are 2+ throughout. Babinski: Toes are flexor bilaterally. Fine motor skills and coordination: intact.  Cerebellar  testing: No dysmetria or intention tremor on finger to nose testing. Heel to shin is unremarkable bilaterally. There is no truncal or gait ataxia.  Sensory exam: intact to light touch.  Gait, station and balance: She stands easily. No veering to one side is noted. No leaning to one side is noted. Posture is age-appropriate and stance is narrow based. Gait shows normal stride length and normal pace. No problems turning are noted. She turns en bloc. Tandem walk is unremarkable.   Assessment and Plan:   In summary, Megan Mcintyre is a very pleasant 59 year old female with an underlying medical history of allergies, PUD, hyperlipidemia, obesity and recurrent headaches, who presents for follow-up consultation of her obstructive sleep apnea, after her recent sleep study and now established on CPAP therapy. The patient is advised regarding her sleep study results in detail. Her sleep study from May 2016 showed severe obstructive sleep apnea which responded rather well to CPAP therapy at 9 cm via nasal pillows. She is fully compliant with treatment but does not always keep the mask on. She does not like to use it but has noticed significant improvement in her daytime energy level, her headaches, and her sleep quality and sleep consolidation. She is encouraged to continue to use CPAP regularly and not skip any nights and try to keep in on as long as she can each night. She demonstrated understanding and agreement. She is congratulated on her treatment adherence. Her residual AHI is borderline at 5.9 per hour but I think it is not enough to increase her pressure and with improved usage all night her numbers may improve as well. Her leak from the mask is acceptable. She is again advised regarding the diagnosis of OSA and its cardiovascular risks and complications if untreated. She is again advised to try to pursue weight loss and healthy lifestyle in good sleep hygiene. I will see her back in about 6 month, sooner if  needed. I answered all her questions today and she was in agreement. I spent 20 minutes in total face-to-face time with the patient, more than 50% of which was spent in counseling and coordination of care, reviewing test results, reviewing medication and discussing or reviewing the diagnosis of OSA, its prognosis and treatment options.

## 2015-07-14 ENCOUNTER — Encounter: Payer: Self-pay | Admitting: Gastroenterology

## 2015-07-14 ENCOUNTER — Ambulatory Visit (AMBULATORY_SURGERY_CENTER): Payer: Medicare Other | Admitting: Gastroenterology

## 2015-07-14 VITALS — BP 124/68 | HR 62 | Temp 96.9°F | Resp 16 | Ht 61.0 in | Wt 212.0 lb

## 2015-07-14 DIAGNOSIS — Z1211 Encounter for screening for malignant neoplasm of colon: Secondary | ICD-10-CM

## 2015-07-14 DIAGNOSIS — D122 Benign neoplasm of ascending colon: Secondary | ICD-10-CM

## 2015-07-14 DIAGNOSIS — K635 Polyp of colon: Secondary | ICD-10-CM

## 2015-07-14 MED ORDER — SODIUM CHLORIDE 0.9 % IV SOLN
500.0000 mL | INTRAVENOUS | Status: DC
Start: 2015-07-14 — End: 2015-07-14

## 2015-07-14 NOTE — Progress Notes (Signed)
A/ox3 pleased with MAC, report to Tracy RN 

## 2015-07-14 NOTE — Op Note (Signed)
[place]  [address] [place], [id number]   COLONOSCOPY PROCEDURE REPORT  PATIENT: Megan Mcintyre, Megan Mcintyre  MR#: [id number] BIRTHDATE: [birth year] , 74  yrs. old GENDER: female ENDOSCOPIST: Milus Banister, MD REFERRED KG:SUPJS Boyd, MD PROCEDURE DATE:  07/14/2015 PROCEDURE:   Colonoscopy, screening and Colonoscopy with snare polypectomy First Screening Colonoscopy - Avg.  risk and is 50 yrs.  old or older Yes.  Prior Negative Screening - Now for repeat screening. N/A  History of Adenoma - Now for follow-up colonoscopy & has been > or = to 3 yrs.  N/A  Polyps removed today? Yes ASA CLASS:   Class II INDICATIONS:Screening for colonic neoplasia and Colorectal Neoplasm Risk Assessment for this procedure is average risk. MEDICATIONS: Monitored anesthesia care and Propofol 200 mg IV  DESCRIPTION OF PROCEDURE:   After the risks benefits and alternatives of the procedure were thoroughly explained, informed consent was obtained.  The digital rectal exam revealed no abnormalities of the rectum.   The LB [id number]  endoscope was introduced through the anus and advanced to the cecum, which was identified by both the appendix and ileocecal valve. No adverse events experienced.   The quality of the prep was excellent.  The instrument was then slowly withdrawn as the colon was fully examined. Estimated blood loss is zero unless otherwise noted in this procedure report.   COLON FINDINGS: A sessile polyp measuring 3 mm in size was found in the ascending colon.  A polypectomy was performed with a cold snare.  The resection was complete, the polyp tissue was completely retrieved and sent to histology.   Small silver colored food wrapper in cecum.  Retroflexed views revealed no abnormalities. The time to cecum = 3.5 Withdrawal time = 11.1   The scope was withdrawn and the procedure completed. COMPLICATIONS: There were no immediate complications.  ENDOSCOPIC IMPRESSION: 1.    Sessile polyp was found in the ascending colon; polypectomy was performed with a cold snare 2.   Small silver colored food wrapper in cecum  RECOMMENDATIONS: If the polyp(s) removed today are proven to be adenomatous (pre-cancerous) polyps, you will need a repeat colonoscopy in 5 years.  Otherwise you should continue to follow colorectal cancer screening guidelines for "routine risk" patients with colonoscopy in 10 years.  You will receive a letter within 1-2 weeks with the results of your biopsy as well as final recommendations.  Please call my office if you have not received a letter after 3 weeks.  eSigned:  Milus Banister, MD 07/14/2015 11:37 AM

## 2015-07-14 NOTE — Progress Notes (Signed)
Called to room to assist during endoscopic procedure.  Patient ID and intended procedure confirmed with present staff. Received instructions for my participation in the procedure from the performing physician.  

## 2015-07-14 NOTE — Patient Instructions (Signed)
Impressions/recommendations:  Polyp (handout given)  Repeat colonoscopy pending pathology results.  YOU HAD AN ENDOSCOPIC PROCEDURE TODAY AT THE [facility]:   Refer to the procedure report that was given to you for any specific questions about what was found during the examination.  If the procedure report does not answer your questions, please call your gastroenterologist to clarify.  If you requested that your care partner not be given the details of your procedure findings, then the procedure report has been included in a sealed envelope for you to review at your convenience later.  YOU SHOULD EXPECT: Some feelings of bloating in the abdomen. Passage of more gas than usual.  Walking can help get rid of the air that was put into your GI tract during the procedure and reduce the bloating. If you had a lower endoscopy (such as a colonoscopy or flexible sigmoidoscopy) you may notice spotting of blood in your stool or on the toilet paper. If you underwent a bowel prep for your procedure, you may not have a normal bowel movement for a few days.  Please Note:  You might notice some irritation and congestion in your nose or some drainage.  This is from the oxygen used during your procedure.  There is no need for concern and it should clear up in a day or so.  SYMPTOMS TO REPORT IMMEDIATELY:   Following lower endoscopy (colonoscopy or flexible sigmoidoscopy):  Excessive amounts of blood in the stool  Significant tenderness or worsening of abdominal pains  Swelling of the abdomen that is new, acute  Fever of 100F or higher   For urgent or emergent issues, a gastroenterologist can be reached at any hour by calling [phone].   DIET: Your first meal following the procedure should be a small meal and then it is ok to progress to your normal diet. Heavy or fried foods are harder to digest and may make you feel nauseous or bloated.  Likewise, meals heavy in dairy and vegetables can  increase bloating.  Drink plenty of fluids but you should avoid alcoholic beverages for 24 hours.  ACTIVITY:  You should plan to take it easy for the rest of today and you should NOT DRIVE or use heavy machinery until tomorrow (because of the sedation medicines used during the test).    FOLLOW UP: Our staff will call the number listed on your records the next business day following your procedure to check on you and address any questions or concerns that you may have regarding the information given to you following your procedure. If we do not reach you, we will leave a message.  However, if you are feeling well and you are not experiencing any problems, there is no need to return our call.  We will assume that you have returned to your regular daily activities without incident.  If any biopsies were taken you will be contacted by phone or by letter within the next 1-3 weeks.  Please call us at [phone] if you have not heard about the biopsies in 3 weeks.    SIGNATURES/CONFIDENTIALITY: You and/or your care partner have signed paperwork which will be entered into your electronic medical record.  These signatures attest to the fact that that the information above on your After Visit Summary has been reviewed and is understood.  Full responsibility of the confidentiality of this discharge information lies with you and/or your care-partner. 

## 2015-07-15 ENCOUNTER — Telehealth: Payer: Self-pay | Admitting: *Deleted

## 2015-07-15 NOTE — Telephone Encounter (Signed)
  Follow up Call-  Call back number 07/14/2015  Post procedure Call Back phone  # [phone] cell  Permission to leave phone message Yes     Patient questions:  Do you have a fever, pain , or abdominal swelling? No. Pain Score  0 *  Have you tolerated food without any problems? Yes.    Have you been able to return to your normal activities? Yes.    Do you have any questions about your discharge instructions: Diet   No. Medications  No. Follow up visit  No.  Do you have questions or concerns about your Care? No.  Actions: * If pain score is 4 or above: No action needed, pain <4.

## 2015-07-19 ENCOUNTER — Encounter (HOSPITAL_COMMUNITY): Payer: Self-pay | Admitting: Emergency Medicine

## 2015-07-19 ENCOUNTER — Emergency Department (INDEPENDENT_AMBULATORY_CARE_PROVIDER_SITE_OTHER)
Admission: EM | Admit: 2015-07-19 | Discharge: 2015-07-19 | Disposition: A | Discharge status: Home / Self Care | Payer: Medicare Other | Source: Home / Self Care | Attending: Family Medicine | Admitting: Family Medicine

## 2015-07-19 DIAGNOSIS — M25561 Pain in right knee: Secondary | ICD-10-CM

## 2015-07-19 DIAGNOSIS — S83411A Sprain of medial collateral ligament of right knee, initial encounter: Secondary | ICD-10-CM

## 2015-07-19 MED ORDER — MELOXICAM 15 MG PO TABS: ORAL_TABLET | ORAL | Status: DC

## 2015-07-19 MED ORDER — RANITIDINE HCL 150 MG PO TABS: ORAL_TABLET | ORAL | Status: DC

## 2015-07-19 MED ORDER — TRAMADOL HCL 50 MG PO TABS: 50.0000 mg | ORAL_TABLET | Freq: Four times a day (QID) | ORAL | Status: DC | PRN

## 2015-07-19 NOTE — ED Provider Notes (Signed)
CSN: [id number]     Arrival date & time 07/19/15  1604 History   First MD Initiated Contact with Patient 07/19/15 1740     Chief Complaint  Patient presents with  . Leg Pain   (Consider location/radiation/quality/duration/timing/severity/associated sxs/prior Treatment) HPI Comments: Patient presents with right knee and lower leg pain. She was stepping up to a stool when she began to have acute pain in the back of the right knee. She feels the knee "gave away". Her pain has continued with mild swelling along the front of the knee and posterior. No redness or warmth of the leg. No further locking or buckling of the knee.   Patient is a 59 y.o. female presenting with leg pain. The history is provided by the patient.  Leg Pain   Past Medical History  Diagnosis Date  . High cholesterol   . Seasonal allergies   . Sleep apnea     uses c-pap  . Allergy   . Depression    Past Surgical History  Procedure Laterality Date  . Hand surgery Bilateral 2007    Carpal tunnel   . Tummy tuck     Family History  Problem Relation Age of Onset  . Migraines Neg Hx   . Colon cancer Neg Hx   . Esophageal cancer Neg Hx   . Rectal cancer Neg Hx   . Stomach cancer Neg Hx   . Heart disease Mother   . Stroke Father   . Diabetes Father    History  Substance Use Topics  . Smoking status: Former Research scientist (life sciences)  . Smokeless tobacco: Never Used     Comment: Quit in 2006  . Alcohol Use: No   OB History    No data available     Review of Systems  All other systems reviewed and are negative.   Allergies  Review of patient's allergies indicates no known allergies.  Home Medications   Prior to Admission medications   Medication Sig Start Date End Date Taking? Authorizing Provider  atorvastatin (LIPITOR) 20 MG tablet Take by mouth daily.   Yes Historical Provider, MD  meloxicam (MOBIC) 15 MG tablet Take 1 a day with food for pain and inflammation x 2 weeks 07/19/15   Bjorn Pippin, PA-C  phentermine  37.5 MG capsule Take 37.5 mg by mouth every morning.    Historical Provider, MD  ranitidine (ZANTAC) 150 MG tablet Take 1 daily with Mobic to protect stomact 07/19/15   Bjorn Pippin, PA-C  traMADol (ULTRAM) 50 MG tablet Take 1 tablet (50 mg total) by mouth every 6 (six) hours as needed for moderate pain. 07/19/15   Bjorn Pippin, PA-C   BP 119/85 mmHg  Pulse 88  Temp(Src) 98.1 F (36.7 C) (Oral)  Resp 16  SpO2 98% Physical Exam  Constitutional: She is oriented to person, place, and time. She appears well-developed and well-nourished. No distress.  Musculoskeletal:  Right knee is painful to touch posterior with possible bakers cyst. Swelling into lower leg and pain along MCL with valgus. No calf erythema or warmth. Negative Homans  Neurological: She is alert and oriented to person, place, and time. No cranial nerve deficit.  Skin: Skin is warm and dry. She is not diaphoretic. No erythema.  Psychiatric: Her behavior is normal.  Nursing note and vitals reviewed.   ED Course  Procedures (including critical care time) Labs Review Labs Reviewed - No data to display  Imaging Review No results found.   MDM  1. Knee MCL sprain, right, initial encounter   2. Knee pain, acute, right    Suspect a MCL strain vs. Baker's cyst eruption. Will place a knee immobilizer, ice, NSAID's (request zantac while taking this) and as needed Tramadol. Suggest f/u with Ortho. Info given.    Bjorn Pippin, PA-C 07/19/15 [phone]

## 2015-07-19 NOTE — ED Notes (Signed)
Pt states that she injured her leg yesterday stepping up a step. Pt states that her right leg is swollen.

## 2015-07-19 NOTE — Discharge Instructions (Signed)
Esguince combinado del ligamento de la rodilla (Combined Knee Ligament Sprain) Un esguince combinado del ligamento de la rodilla es el desgarro de ms de uno de los ligamentos principales. Ellos son el ligamento cruzado anterior (LCA), el ligamento cruzado posterior (LCP), el ligamento colateral medial (LCM) y el ligamento colateral lateral (LCL). Los ligamentos [employer]. Generalmente cruzan una articulacin para [employer]. Los ligamentos de la rodilla mantienen alineados los huesos del fmur y Database administrator. Estos ligamentos permiten que la articulacin se mueva dentro de cierta amplitud de movimientos. Los movimientos fuera de esta amplitud causan un esguince. Las lesiones simultneas en los ligamentos mltiples causan dificultad en la prctica de deportes y en la vida diaria. Las lesiones ms frecuentes involucran al ligamento cruzado anterior y al ligamento colateral medio. SNTOMAS  Sensacin de estallido o ruptura en el momento de la lesin.  Imposibilidad de continuar la actividad despus de la lesin.  Inflamacin de la rodilla dentro de las 6 horas posteriores a la lesin.  Posiblemente se observe deformidad de la rodilla.  Imposibilidad para estirar la rodilla.  Sensacin de que la rodilla sale de su lugar o se dobla.  En algunos casos la rodilla se traba, si hay un dao en el cartlago de la articulacin (menisco).  En casos poco frecuentes, adormecimiento, debilidad, parlisis, cambio en el color o enfriamiento debido a una lesin en el nervio o en un vaso sanguneo. [place] de mltiples ligamentos se produce cuando se ejerce una fuerza [employer], que es mayor a la que pueden soportar. Generalmente la causa es un golpe directo (traumatismo). Tambin puede ser consecuencia de una lesin en la que no hay contacto (hiperextensin con giro de la rodilla).  LOS RIESGOS AUMENTAN CON:  En los deportes de contacto (ftbol americano, rugby). Los  deportes que requieren [employer], Public affairs consultant, interrumpir una carrera o cambiar de direccin (bsquet, gimnasia, ftbol, voley). Deportes en suelos desparejos, (carreras a travs del campo, ftbol).  Poca fuerza y flexibilidad.  Usar equipos que no adapten adecuadamente, o que no tengan la proteccin [place]. PREVENCIN  Precalentamiento adecuado y elongacin antes de la [place].  Mantener la forma fsica:  Flexibilidad en el muslo, la pierna y la rodilla.  La fuerza y la resistencia muscular.  Aprenda y [employer].  Utilice el equipo adecuado (tamao adecuado del taco segn la superficie). PRONSTICO Sin el tratamiento adecuado, la rodilla seguir doblndose y estar vulnerable a sufrir lesiones recurrentes al Careers information officer o en la vida diaria. Si la lesin incluye el dao a un nervio o arteria, aumenta la probabilidad de un mal resultado. Generalmente es necesario someterse a una ciruga para recobrar la estabilidad de la rodilla. COMPLICACIONES RELACIONADAS Generalmente los sntomas recurrentes son:  La rodilla se Therapist, occupational.  Inestabilidad de la articulacin.  Inflamacin  Lesin en el cartlago de la articulacin. Esto puede hacer que la rodilla se trabe o se hinche.  Lesin en el cartlago de la articulacin del fmur o la tibia. Puede causar artritis en la rodilla.  Lesin a otros ligamentos de la rodilla.  Rigidez de la rodilla (prdida del movimiento).  Lesin [employer] nervios (adormecimiento, debilidad, parlisis) o en las arterias.  Amputacin de la pierna debido a traumatismo en el nervio o en la arteria. TRATAMIENTO El tratamiento inicial incluye el uso de medicamentos y la aplicacin de hielo para reducir Conservation officer, historic buildings y la inflamacin. Le indicarn el uso de muletas para disminuir  el dolor al caminar. La rodilla podr ser inmovilizada. La rehabilitacin se centra en disminuir de la hinchazn, recobrar la amplitud de  movimientos y el control muscular y la fuerza. Tambin incluye el entrenamiento [place], el uso de un soporte y Biomedical scientist. (Evite los deportes que implican el pivoteo, corte, cambio de direccin, salto y cadas). La ciruga generalmente ofrece la mejor probabilidad de una recuperacin completa. La ciruga para las lesiones combinadas de ligamento cruzado anterior y ligamento colateral medio incluye la reconstruccin del ligamento cruzado anterior. Esto tambin permite la curacin del ligamento colateral medio. Algunos deportistas nunca podrn volver a Radiation protection practitioner nivel de competicin, a pesar de someterse a una ciruga. La posibilidad de volver a Psychologist, prison and probation services deportes depende de las lesiones relacionadas y las demandas del deporte.  MEDICAMENTOS  Si es necesaria la administracin de medicamentos para Conservation officer, historic buildings, se recomiendan los antiinflamatorios no esteroides, como aspirina e ibuprofeno y otros calmantes menores, como acetaminofeno.  No tome medicamentos para el dolor dentro de los 7 das previos a la [place].  Pueden prescribirle analgsicos ms fuertes. Utilcelos como se le indique y slo cuando lo necesite.  Contctese inmediatamente con el mdico si observa sangrado, malestar estomacal o signos de Secondary school teacher. TERAPIA FRA El fro (con hielo) debe aplicarse durante 10 a 15 minutos cada 2  3 horas para reducir la inflamacin y Conservation officer, historic buildings e inmediatamente despus de cualquier actividad que agrava los sntomas. Utilice bolsas o un masaje de hielo. SOLICITE ATENCIN MDICA SI:   Los sntomas empeoran o no mejoran en 6 semanas, a pesar de Chiropodist.  Si despus del traumatismo o de la ciruga aparece alguno de los siguientes sntomas:  Dolor, adormecimiento, enfriamiento o coloracin azul, gris u oscura en el pie o en las uas.  fiebre, dolor intenso, hinchazn, enrojecimiento, drena lquido o sangra en la regin de la herida.  Aparecen signos de  infeccin (dolor de [place], dolores musculares, [place], [place], o una sensacin general de Tree surgeon)  Desarrolla nuevos e inexplicables sntomas. (Los medicamentos indicados en el tratamiento le ocasionan efectos secundarios). Document Released: 09/21/2006 Document Revised: 02/27/2012 [facility] Patient Information 2015 [place]. This information is not intended to replace advice given to you by your health care provider. Make sure you discuss any questions you have with your health care provider.     You have a probable ligament sprain. Treat with ice to area 61min every 2 hours. Rest and wear the immobilizer at all times. Take the Mobic with Zantac daily and use the pain medication when needed. F/U with ortho if needed.

## 2015-07-21 ENCOUNTER — Encounter: Payer: Self-pay | Admitting: Gastroenterology

## 2015-12-14 IMAGING — DX DG KNEE COMPLETE 4+V*L*
4 series · 4 of 4 positions shown · non-contrast
Comparison: None.

CLINICAL DATA: Left knee pain with prior injury approximately 1
month ago. Initial encounter.

EXAM:
LEFT KNEE - COMPLETE 4+ VIEW

[knee ap]
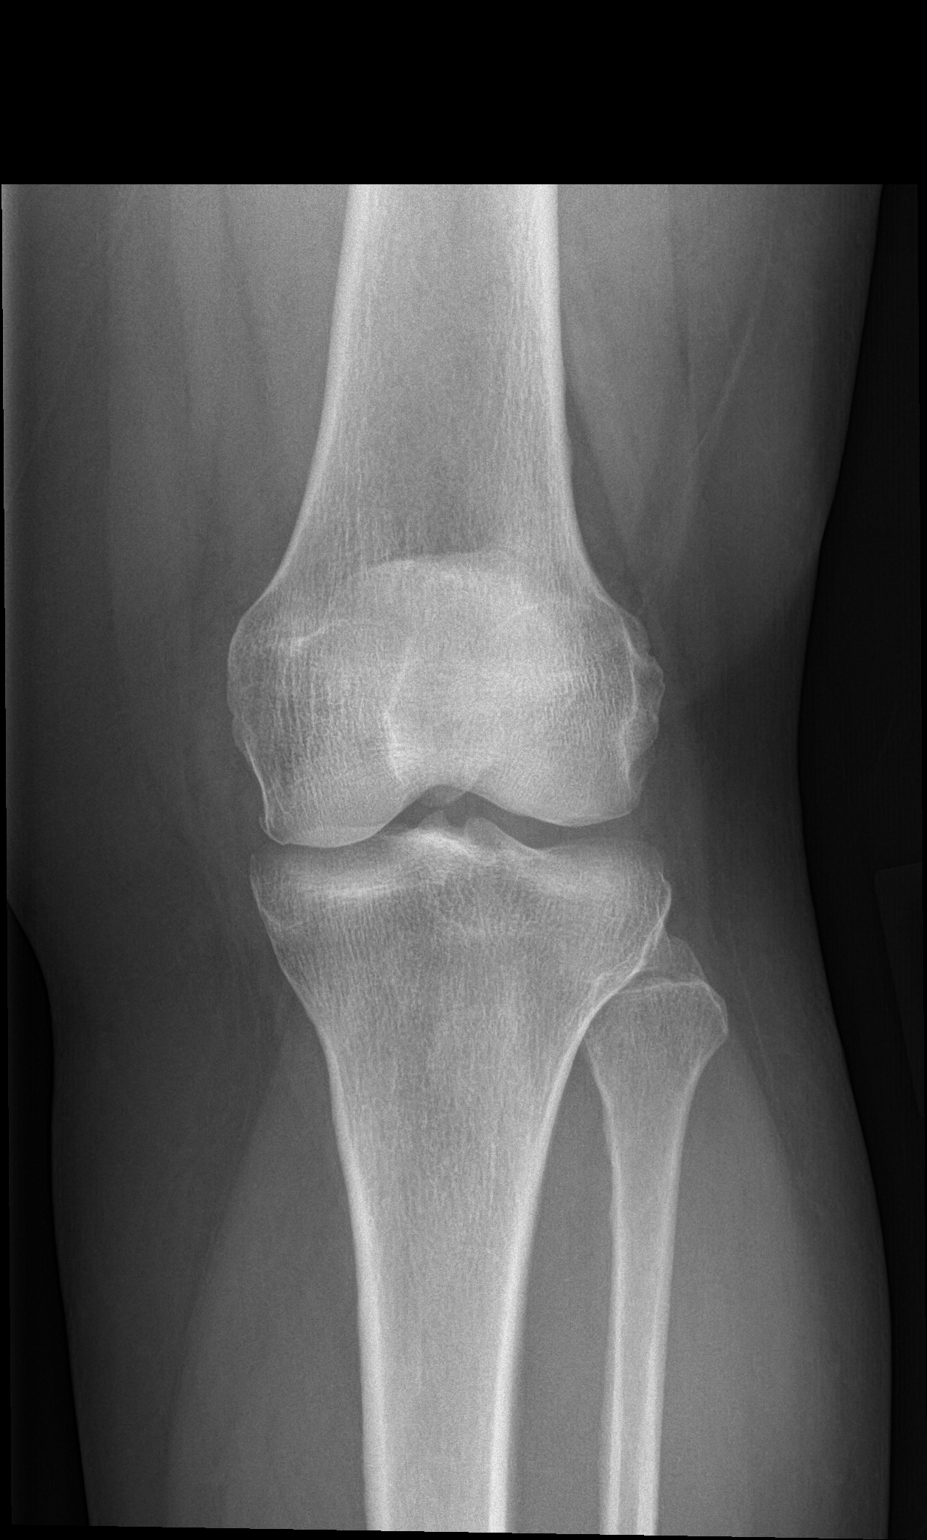

[knee obl (1 of 2)]
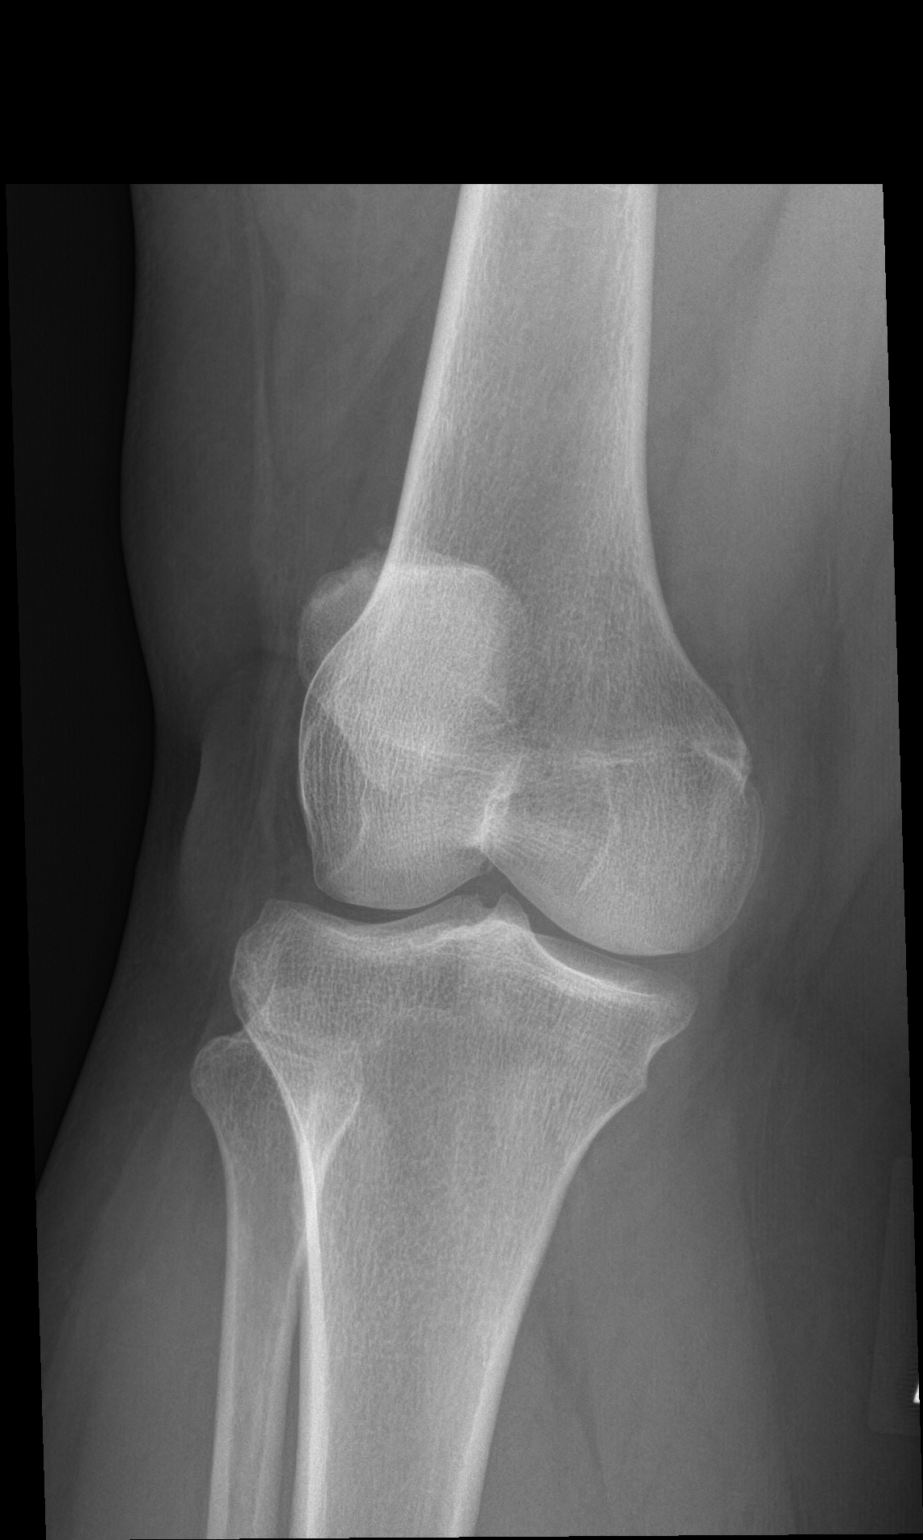

[knee obl (2 of 2)]
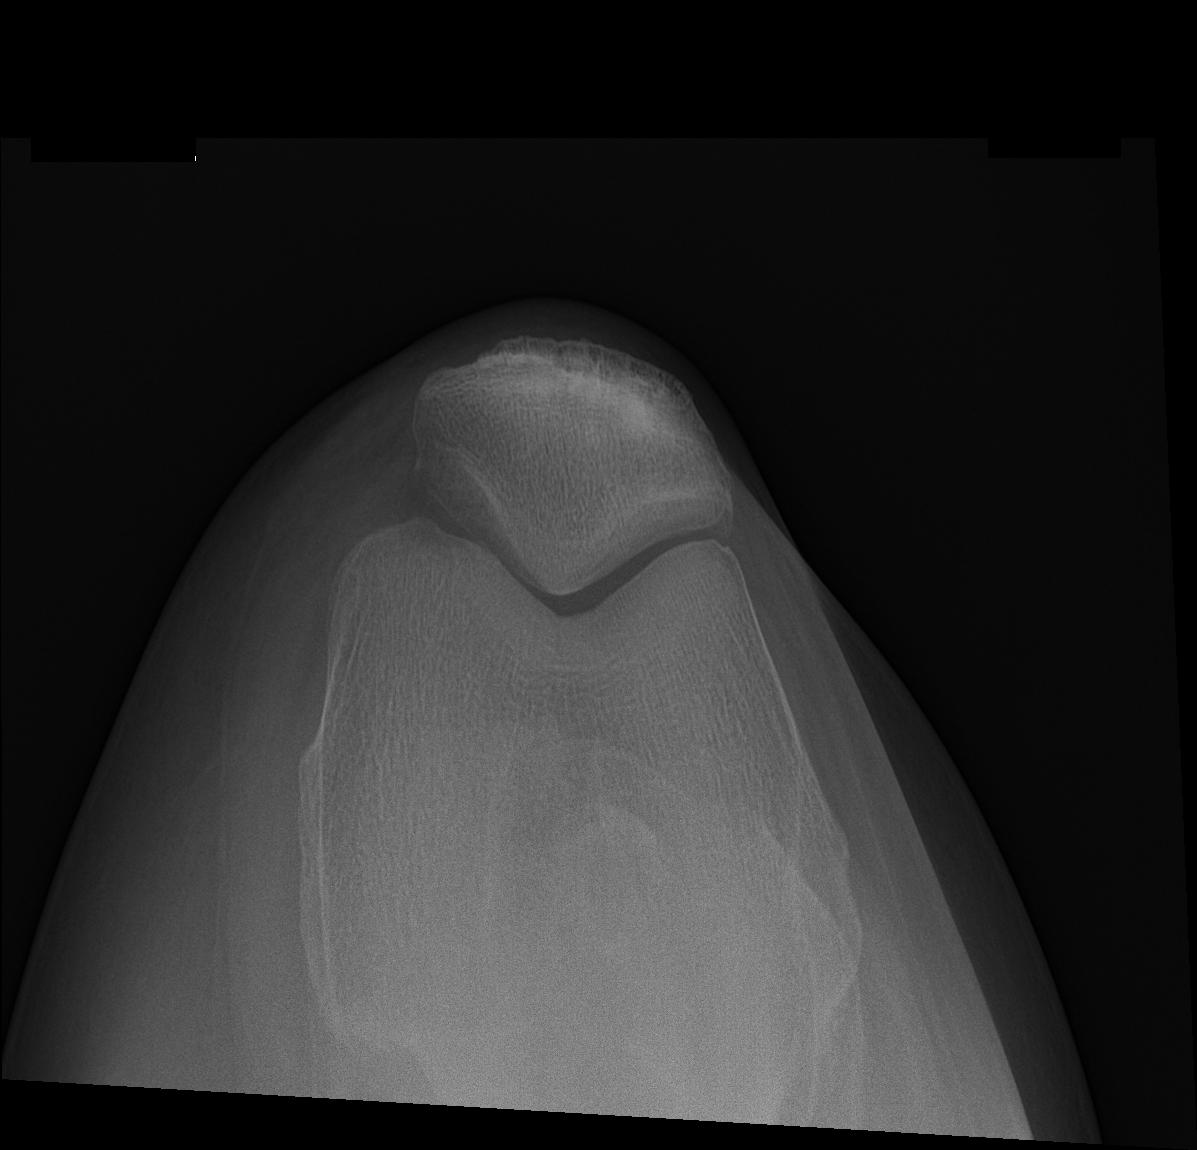

[knee lat]
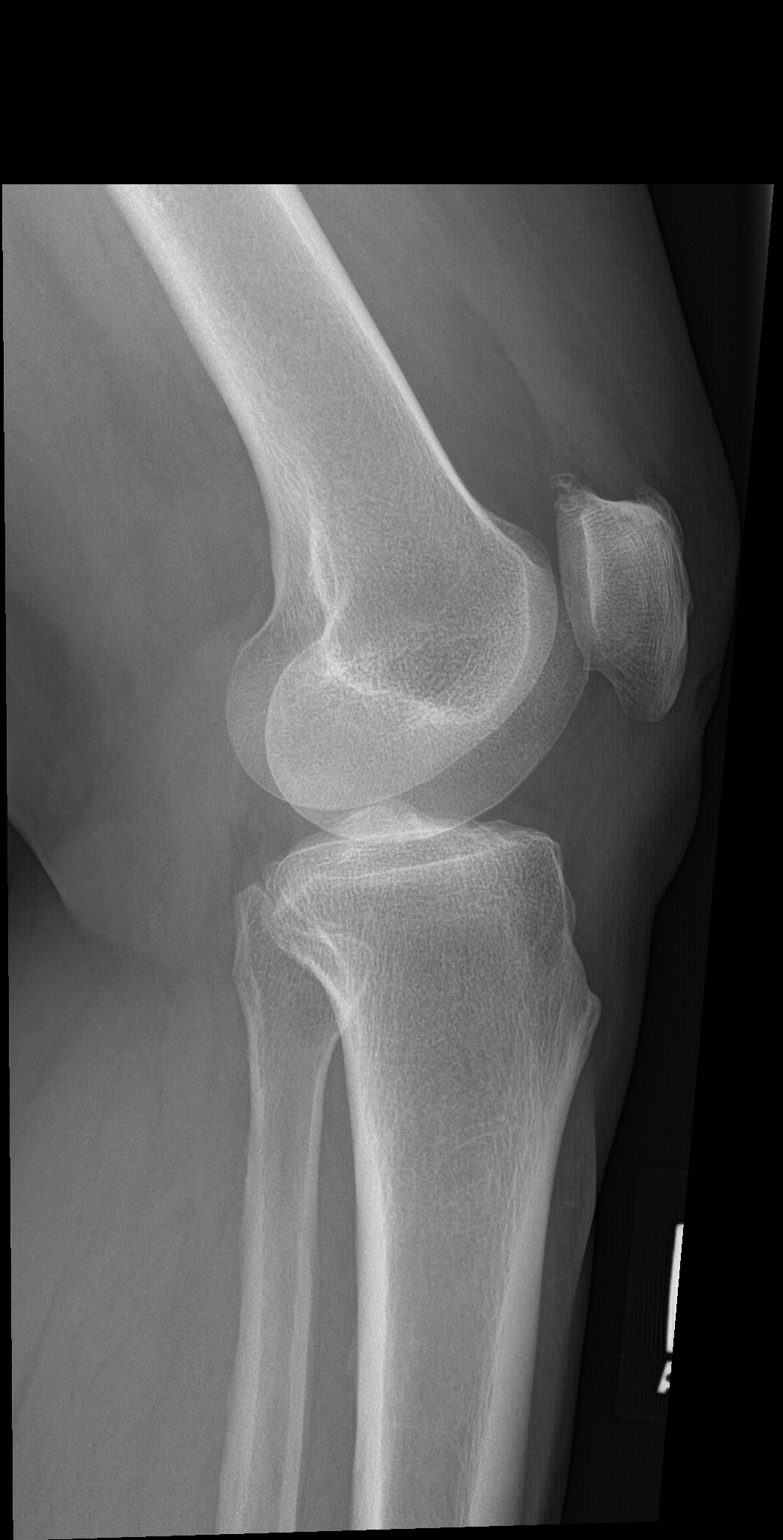

[4 of 4 positions shown; findings below may reference images not displayed]

FINDINGS: No fracture, dislocation or joint effusion is identified. Mild
proliferative changes are seen involving the patella. No significant
joint space narrowing identified. No bony lesions or destruction
seen. Soft tissues are unremarkable.
IMPRESSION: No acute findings.  Mild patellofemoral disease.

## 2016-01-14 ENCOUNTER — Telehealth: Payer: Self-pay

## 2016-01-14 ENCOUNTER — Ambulatory Visit: Payer: Self-pay | Admitting: Neurology

## 2016-01-14 NOTE — Telephone Encounter (Signed)
[facility] home number, no answer, no vm. LM on cell phone to bring in CPAP machine or memory card from machine so we can get recent download.

## 2016-01-22 ENCOUNTER — Encounter: Payer: Self-pay | Admitting: Neurology

## 2016-02-18 ENCOUNTER — Encounter: Payer: Self-pay | Admitting: Neurology

## 2016-02-18 ENCOUNTER — Ambulatory Visit (INDEPENDENT_AMBULATORY_CARE_PROVIDER_SITE_OTHER): Payer: Medicare Other | Admitting: Neurology

## 2016-02-18 VITALS — BP 122/74 | HR 78 | Resp 18 | Ht 61.0 in | Wt 211.0 lb

## 2016-02-18 DIAGNOSIS — G4733 Obstructive sleep apnea (adult) (pediatric): Secondary | ICD-10-CM | POA: Diagnosis not present

## 2016-02-18 DIAGNOSIS — Z9989 Dependence on other enabling machines and devices: Principal | ICD-10-CM

## 2016-02-18 DIAGNOSIS — E669 Obesity, unspecified: Secondary | ICD-10-CM | POA: Diagnosis not present

## 2016-02-18 NOTE — [facility] Notes (Signed)
Subjective:    Patient ID: Aeva Posey is a 60 y.o. female.  HPI     Interim history:    Ms. Flinders is a 60 year old right-handed woman with an underlying medical history of allergies, PUD, hyperlipidemia, obesity and recurrent headaches, who presents for follow-up consultation of her obstructive sleep apnea, on CPAP therapy. The patient is unaccompanied today. Of note, the patient no showed for an appointment on 01/14/2016. I last saw her on 07/09/15, at which time she reported doing better with regards to her recurrent headaches and her daytime energy as well as her sleep quality when using CPAP, even though she did not like to use it. He was compliant with treatment and she was planning to take an extended trip to the [place] and was planning to take her machine with her. She was advised to continue treatment regularly.  Today, 02/18/2016: I reviewed her CPAP compliance data from 01/17/2016 through 02/15/2016 which is a total of 30 days during which time she used her machine only 12 days with percent used days greater than 4 hours at 0%, indicating noncompliance with an average usage of only 40 minutes, residual AHI borderline at 5 per hour, leak borderline with the 95th percentile at 23.3 L/m on a pressure of 9 cm with EPR of 3.   Today, 02/18/2016: She reports she has been trying to use her machine, but it leaks and makes abnormal noises, took it to [place] and returned from there on 01/29/16. She has not had new supplies. She also finds the CPAP quite cumbersome. She would like to see if there is other options, including surgical options. She is trying to lose weight. Weight has been overall stable. She has been on phentermine for weight loss but it causes her insomnia. Headache-wise she has been stable she feels.  Previously:   I first met her on 04/28/2015 at the request of Dr. Jaynee Eagles, at which time the patient reported snoring, witnessed breathing pauses while  asleep and morning headaches as well as excessive daytime somnolence. I invited her back for a sleep study. She had a split-night sleep study on 05/03/2015 underwent over her test results with her in detail today. Her baseline sleep efficiency was 92.7% with a latency to sleep of 2 minutes and wake after sleep onset of 10.5 minutes with moderate sleep fragmentation noted. She had no significant PLMS, EKG or EEG changes. She had mild to moderate snoring. Her total AHI was 33 per hour. Average oxygen saturation was 93%, nadir was 73% in REM sleep. She was then titrated on CPAP. Sleep efficiency was 95.1%. She had a normal arousal index. Her average oxygen saturation was 94%, nadir was 89%. CPAP was titrated from 5-9 cm, her AHI was reduced to 0 per hour at the final pressure with supine REM sleep achieved. Based on her test results I prescribed CPAP therapy for home use.  I reviewed her CPAP compliance data from 06/08/2015 through 07/07/2015 which is a total of 30 days during which time she used her machine every night with percent used days greater than 4 hours at 77%, indicating good compliance with an average usage of 4 hours and 39 minutes, residual AHI borderline at 5.9 per hour, leak acceptable with the 95th percentile at 20.7 L/m and a pressure of 9 cm with EPR of 3.   She reports snoring, witnessed breathing pauses while asleep (per husband, who stays in the [place] sleep disruption, morning headaches, and excessive daytime somnolence.  She had a CTH wo contrast on 04/22/15, which was reported as normal.  I personally reviewed the images through the PACS system.  She does not have a set bed time or wake time routine. She says that no matter how early she may go to bed she never wakes up rested. Often she will stay in bed after waking up because she does not feel like getting out of bed. She has some depressive symptoms but is not on any medication for this. She does not like to take  medications. She has a history of gastric ulcer but is currently not on an anti-acid medication. She is quite frustrated. She just wants to feel better. She has 3 grown children. She lives alone. She does not drink any alcohol, she quit smoking about 10 years ago and does not drink any sodas. She has occasional aching in her legs. She has occasional cramps in her legs. She's not sure if she kicks her twitches in her sleep. She does not typically get up to use the bathroom at night. She often wakes up with a headache which is a global aching sensation.  She snores and this can be allowed per husband. He has noted breathing pauses while she is asleep. She wakes up with a sense of gasping and panic. She has had shortness of breath after climbing one flight of stairs. She has left knee pain.  Her Past Medical History Is Significant For: Past Medical History  Diagnosis Date  . High cholesterol   . Seasonal allergies   . Sleep apnea     uses c-pap  . Allergy   . Depression     Her Past Surgical History Is Significant For: Past Surgical History  Procedure Laterality Date  . Hand surgery Bilateral 2007    Carpal tunnel   . Tummy tuck      Her Family History Is Significant For: Family History  Problem Relation Age of Onset  . Migraines Neg Hx   . Colon cancer Neg Hx   . Esophageal cancer Neg Hx   . Rectal cancer Neg Hx   . Stomach cancer Neg Hx   . Heart disease Mother   . Stroke Father   . Diabetes Father     Her Social History Is Significant For: Social History   Social History  . Marital Status: Single    Spouse Name: N/A  . Number of Children: 3  . Years of Education: 12   Occupational History  . Unemployed    Social History Main Topics  . Smoking status: Former Research scientist (life sciences)  . Smokeless tobacco: Never Used     Comment: Quit in 2006  . Alcohol Use: No  . Drug Use: No  . Sexual Activity: Not Asked   Other Topics Concern  . None   Social History Narrative   Lives at  home by herself.      Caffeine: none    Her Allergies Are:  No Known Allergies:   Her Current Medications Are:  Outpatient Encounter Prescriptions as of 02/18/2016  Medication Sig  . [DISCONTINUED] atorvastatin (LIPITOR) 20 MG tablet Take by mouth daily.  . [DISCONTINUED] meloxicam (MOBIC) 15 MG tablet Take 1 a day with food for pain and inflammation x 2 weeks  . [DISCONTINUED] phentermine 37.5 MG capsule Take 37.5 mg by mouth every morning.  . [DISCONTINUED] ranitidine (ZANTAC) 150 MG tablet Take 1 daily with Mobic to protect stomact  . [DISCONTINUED] traMADol (ULTRAM) 50 MG tablet Take 1  tablet (50 mg total) by mouth every 6 (six) hours as needed for moderate pain.   No facility-administered encounter medications on file as of 02/18/2016.  : Review of Systems:  Out of a complete 14 point review of systems, all are reviewed and negative with the exception of these symptoms as listed below:   Review of Systems  Neurological:       Patient states that she has not been using her CPAP because "it makes a noise" and is bothering her nose.  She admits that she needs to make appt with DME to fix the issue.     Objective:  Neurologic Exam  Physical Exam Physical Examination:   Filed Vitals:   02/18/16 1041  BP: 122/74  Pulse: 78  Resp: 18   General Examination: The patient is a very pleasant 60 y.o. female in no acute distress. She appears well-developed and well-nourished and well groomed. She is obese. She is in good spirits today.  HEENT: Normocephalic, atraumatic, pupils are equal, round and reactive to light and accommodation. Extraocular tracking is good without limitation to gaze excursion or nystagmus noted. Normal smooth pursuit is noted. Hearing is grossly intact. Face is symmetric with normal facial animation and normal facial sensation. Speech is clear with no dysarthria noted. There is no hypophonia. There is no lip, neck/head, jaw or voice tremor. Neck is supple with full  range of passive and active motion. There are no carotid bruits on auscultation. Oropharynx exam reveals: mild mouth dryness, adequate dental hygiene and marked airway crowding, due to narrow airway entry, thick soft palate and large uvula, larger tongue and larger tonsils of about 3+ bilaterally. She does not have an overbite.   Chest: Clear to auscultation without wheezing, rhonchi or crackles noted.  Heart: S1+S2+0, regular and normal without murmurs, rubs or gallops noted.   Abdomen: Soft, non-tender and non-distended with normal bowel sounds appreciated on auscultation.  Extremities: There is no pitting edema in the distal lower extremities bilaterally. Pedal pulses are intact.  Skin: Warm and dry without trophic changes noted. There are no varicose veins.  Musculoskeletal: exam reveals no obvious joint deformities, tenderness or joint swelling or erythema.   Neurologically:  Mental status: The patient is awake, alert and oriented in all 4 spheres. Her immediate and remote memory, attention, language skills and fund of knowledge are appropriate. There is no evidence of aphasia, agnosia, apraxia or anomia. Speech is clear with normal prosody and enunciation. Thought process is linear. Mood is normal and affect is increased in intensity.  Cranial nerves II - XII are as described above under HEENT exam. In addition: shoulder shrug is normal with equal shoulder height noted. Motor exam: Normal bulk, strength and tone is noted. There is no drift, tremor or rebound. Romberg is negative. Reflexes are 2+ throughout. Fine motor skills and coordination: intact.  Cerebellar testing: No dysmetria or intention tremor on finger to nose testing. Heel to shin is unremarkable bilaterally. There is no truncal or gait ataxia.  Sensory exam: intact to light touch in the UEs and LEs.  Gait, station and balance: She stands easily. No veering to one side is noted. No leaning to one side is noted. Posture is  age-appropriate and stance is narrow based. Gait shows normal stride length and normal pace. No problems turning are noted. She turns en bloc. Tandem walk is unremarkable.   Assessment and Plan:   In summary, Nykeria Mealing is a very pleasant 60 year old female with an underlying  medical history of allergies, PUD, hyperlipidemia, obesity and recurrent headaches, who presents for follow-up consultation of her severe obstructive sleep apnea, on CPAP therapy with initially good compliance but poor compliance in the past 6 months. She feels that her machine is making abnormal noises. She also finds it more and more cumbersome to use. She has been trying to lose weight but has not yet lost much in the way of weight. She is on phentermine which causes her some insomnia side effects as well. She has not had any CPAP supplies in over 6 months. We will get in touch with her DME company and she is also advised to get in touch with them to make an appointment for mask refit and to check her CPAP machine. In addition, she would like to explore surgical treatment options for OSA. In light of her crowded airway, she may be a reasonable surgical candidate. I made a referral to ENT. She just returned from her 6 month trip to the [place]  We talked about her split night study from 05/03/15 again today and I explained her study results to her again today. She responded rather well to CPAP therapy at 9 cm via nasal pillows.  She does not like to use CPAP, albeit noticing sinificant improvement in her daytime energy level, her headaches, and her sleep quality and sleep consolidation. She is encouraged to continue to use CPAP regularly and not skip any nights and try to keep in on as long as she can each night.  we will also await ENT evaluation and recommendations.  I will see her back in about 6 month, sooner if needed. I answered all her questions today and she was in agreement.  I spent 25 minutes in total  face-to-face time with the patient, more than 50% of which was spent in counseling and coordination of care, reviewing test results, reviewing medication and discussing or reviewing the diagnosis of OSA, its prognosis and treatment options.

## 2016-02-18 NOTE — Patient Instructions (Signed)
Please continue using your CPAP regularly. While your insurance requires that you use CPAP at least 4 hours each night on 70% of the nights, I recommend, that you not skip any nights and use it throughout the night if you can. Getting used to CPAP and staying with the treatment long term does take time and patience and discipline. Untreated obstructive sleep apnea when it is moderate to severe can have an adverse impact on cardiovascular health and raise her risk for heart disease, arrhythmias, hypertension, congestive heart failure, stroke and diabetes. Untreated obstructive sleep apnea causes sleep disruption, nonrestorative sleep, and sleep deprivation. This can have an impact on your day to day functioning and cause daytime sleepiness and impairment of cognitive function, memory loss, mood disturbance, and problems focussing. Using CPAP regularly can improve these symptoms.  Please make an appointment with [facility].   I will make a referral to ENT, they will call you for an appointment.

## 2016-04-29 ENCOUNTER — Other Ambulatory Visit: Payer: Self-pay | Admitting: Family Medicine

## 2016-04-29 DIAGNOSIS — Z1231 Encounter for screening mammogram for malignant neoplasm of breast: Secondary | ICD-10-CM

## 2016-05-17 ENCOUNTER — Ambulatory Visit
Admission: RE | Admit: 2016-05-17 | Discharge: 2016-05-17 | Disposition: A | Discharge status: Home / Self Care | Payer: Medicare Other | Source: Ambulatory Visit | Attending: Family Medicine | Admitting: Family Medicine

## 2016-05-17 DIAGNOSIS — Z1231 Encounter for screening mammogram for malignant neoplasm of breast: Secondary | ICD-10-CM

## 2016-06-09 ENCOUNTER — Encounter (HOSPITAL_COMMUNITY): Payer: Self-pay | Admitting: Emergency Medicine

## 2016-06-09 ENCOUNTER — Emergency Department (HOSPITAL_COMMUNITY): Payer: Medicare Other

## 2016-06-09 ENCOUNTER — Other Ambulatory Visit: Payer: Self-pay

## 2016-06-09 DIAGNOSIS — Z87891 Personal history of nicotine dependence: Secondary | ICD-10-CM | POA: Diagnosis not present

## 2016-06-09 DIAGNOSIS — R109 Unspecified abdominal pain: Secondary | ICD-10-CM | POA: Insufficient documentation

## 2016-06-09 DIAGNOSIS — R0789 Other chest pain: Secondary | ICD-10-CM | POA: Insufficient documentation

## 2016-06-09 DIAGNOSIS — R079 Chest pain, unspecified: Secondary | ICD-10-CM | POA: Diagnosis present

## 2016-06-09 LAB — I-STAT TROPONIN, ED: TROPONIN I, POC: 0 ng/mL (ref 0.00–0.08)

## 2016-06-09 LAB — CBC
HCT: 38.2 % (ref 36.0–46.0)
HEMOGLOBIN: 12.4 g/dL (ref 12.0–15.0)
MCH: 29.3 pg (ref 26.0–34.0)
MCHC: 32.5 g/dL (ref 30.0–36.0)
MCV: 90.3 fL (ref 78.0–100.0)
Platelets: 265 10*3/uL (ref 150–400)
RBC: 4.23 MIL/uL (ref 3.87–5.11)
RDW: 13.4 % (ref 11.5–15.5)
WBC: 7 10*3/uL (ref 4.0–10.5)

## 2016-06-09 LAB — BASIC METABOLIC PANEL
ANION GAP: 7 (ref 5–15)
BUN: 11 mg/dL (ref 6–20)
CALCIUM: 9.4 mg/dL (ref 8.9–10.3)
CO2: 26 mmol/L (ref 22–32)
Chloride: 105 mmol/L (ref 101–111)
Creatinine, Ser: 0.91 mg/dL (ref 0.44–1.00)
GFR calc non Af Amer: >60 mL/min (ref >60)
Glucose, Bld: 93 mg/dL (ref 65–99)
Potassium: 4.1 mmol/L (ref 3.5–5.1)
SODIUM: 138 mmol/L (ref 135–145)

## 2016-06-09 NOTE — ED Notes (Signed)
The patient has been having chest pain for two days.  She denies nausea and vomiting but does left thumb and back pain.  She did say her mother died of a heart attack.  The patient has not taken anything other than a baby aspirin.  She came to the ED because the pain got worse.

## 2016-06-10 ENCOUNTER — Emergency Department (HOSPITAL_COMMUNITY)
Admission: EM | Admit: 2016-06-10 | Discharge: 2016-06-10 | Disposition: A | Discharge status: Home / Self Care | Payer: Medicare Other | Attending: Emergency Medicine | Admitting: Emergency Medicine

## 2016-06-10 DIAGNOSIS — R0789 Other chest pain: Secondary | ICD-10-CM | POA: Diagnosis not present

## 2016-06-10 DIAGNOSIS — R109 Unspecified abdominal pain: Secondary | ICD-10-CM

## 2016-06-10 LAB — URINALYSIS, ROUTINE W REFLEX MICROSCOPIC
Bilirubin Urine: NEGATIVE
GLUCOSE, UA: NEGATIVE mg/dL
HGB URINE DIPSTICK: NEGATIVE
Ketones, ur: NEGATIVE mg/dL
Leukocytes, UA: NEGATIVE
Nitrite: NEGATIVE
Protein, ur: NEGATIVE mg/dL
SPECIFIC GRAVITY, URINE: 1.013 (ref 1.005–1.030)
pH: 6 (ref 5.0–8.0)

## 2016-06-10 LAB — I-STAT TROPONIN, ED: Troponin i, poc: 0.01 ng/mL (ref 0.00–0.08)

## 2016-06-10 LAB — HEPATIC FUNCTION PANEL
ALBUMIN: 4 g/dL (ref 3.5–5.0)
ALK PHOS: 54 U/L (ref 38–126)
ALT: 28 U/L (ref 14–54)
AST: 29 U/L (ref 15–41)
Bilirubin, Direct: <0.1 mg/dL — ABNORMAL LOW (ref 0.1–0.5)
Total Bilirubin: 0.6 mg/dL (ref 0.3–1.2)
Total Protein: 6.8 g/dL (ref 6.5–8.1)

## 2016-06-10 LAB — LIPASE, BLOOD: LIPASE: 30 U/L (ref 11–51)

## 2016-06-10 MED ORDER — OMEPRAZOLE 20 MG PO CPDR: 20.0000 mg | DELAYED_RELEASE_CAPSULE | Freq: Every day | ORAL | Status: AC

## 2016-06-10 NOTE — Discharge Instructions (Signed)
You were seen today for chest pain. The cause of your chest pain is unknown at this time. However, your workup is reassuring. Follow-up with cardiology for further evaluation and possible stress testing.  Nonspecific Chest Pain  Chest pain can be caused by many different conditions. There is always a chance that your pain could be related to something serious, such as a heart attack or a blood clot in your lungs. Chest pain can also be caused by conditions that are not life-threatening. If you have chest pain, it is very important to follow up with your health care provider. CAUSES  Chest pain can be caused by:  Heartburn.  Pneumonia or bronchitis.  Anxiety or stress.  Inflammation around your heart (pericarditis) or lung (pleuritis or pleurisy).  A blood clot in your lung.  A collapsed lung (pneumothorax). It can develop suddenly on its own (spontaneous pneumothorax) or from trauma to the chest.  Shingles infection (varicella-zoster virus).  Heart attack.  Damage to the bones, muscles, and cartilage that make up your chest wall. This can include:  Bruised bones due to injury.  Strained muscles or cartilage due to frequent or repeated coughing or overwork.  Fracture to one or more ribs.  Sore cartilage due to inflammation (costochondritis). RISK FACTORS  Risk factors for chest pain may include:  Activities that increase your risk for trauma or injury to your chest.  Respiratory infections or conditions that cause frequent coughing.  Medical conditions or overeating that can cause heartburn.  Heart disease or family history of heart disease.  Conditions or health behaviors that increase your risk of developing a blood clot.  Having had chicken pox (varicella zoster). SIGNS AND SYMPTOMS Chest pain can feel like:  Burning or tingling on the surface of your chest or deep in your chest.  Crushing, pressure, aching, or squeezing pain.  Dull or sharp pain that is worse  when you move, cough, or take a deep breath.  Pain that is also felt in your back, neck, shoulder, or arm, or pain that spreads to any of these areas. Your chest pain may come and go, or it may stay constant. DIAGNOSIS Lab tests or other studies may be needed to find the cause of your pain. Your health care provider may have you take a test called an ambulatory ECG (electrocardiogram). An ECG records your heartbeat patterns at the time the test is performed. You may also have other tests, such as:  Transthoracic echocardiogram (TTE). During echocardiography, sound waves are used to create a picture of all of the heart structures and to look at how blood flows through your heart.  Transesophageal echocardiogram (TEE).This is a more advanced imaging test that obtains images from inside your body. It allows your health care provider to see your heart in finer detail.  Cardiac monitoring. This allows your health care provider to monitor your heart rate and rhythm in real time.  Holter monitor. This is a portable device that records your heartbeat and can help to diagnose abnormal heartbeats. It allows your health care provider to track your heart activity for several days, if needed.  Stress tests. These can be done through exercise or by taking medicine that makes your heart beat more quickly.  Blood tests.  Imaging tests. TREATMENT  Your treatment depends on what is causing your chest pain. Treatment may include:  Medicines. These may include:  Acid blockers for heartburn.  Anti-inflammatory medicine.  Pain medicine for inflammatory conditions.  Antibiotic medicine, if an  infection is present.  Medicines to dissolve blood clots.  Medicines to treat coronary artery disease.  Supportive care for conditions that do not require medicines. This may include:  Resting.  Applying heat or cold packs to injured areas.  Limiting activities until pain decreases. HOME CARE  INSTRUCTIONS  If you were prescribed an antibiotic medicine, finish it all even if you start to feel better.  Avoid any activities that bring on chest pain.  Do not use any tobacco products, including cigarettes, chewing tobacco, or electronic cigarettes. If you need help quitting, ask your health care provider.  Do not drink alcohol.  Take medicines only as directed by your health care provider.  Keep all follow-up visits as directed by your health care provider. This is important. This includes any further testing if your chest pain does not go away.  If heartburn is the cause for your chest pain, you may be told to keep your head raised (elevated) while sleeping. This reduces the chance that acid will go from your stomach into your esophagus.  Make lifestyle changes as directed by your health care provider. These may include:  Getting regular exercise. Ask your health care provider to suggest some activities that are safe for you.  Eating a heart-healthy diet. A registered dietitian can help you to learn healthy eating options.  Maintaining a healthy weight.  Managing diabetes, if necessary.  Reducing stress. SEEK MEDICAL CARE IF:  Your chest pain does not go away after treatment.  You have a rash with blisters on your chest.  You have a fever. SEEK IMMEDIATE MEDICAL CARE IF:   Your chest pain is worse.  You have an increasing cough, or you cough up blood.  You have severe abdominal pain.  You have severe weakness.  You faint.  You have chills.  You have sudden, unexplained chest discomfort.  You have sudden, unexplained discomfort in your arms, back, neck, or jaw.  You have shortness of breath at any time.  You suddenly start to sweat, or your skin gets clammy.  You feel nauseous or you vomit.  You suddenly feel light-headed or dizzy.  Your heart begins to beat quickly, or it feels like it is skipping beats. These symptoms may represent a serious  problem that is an emergency. Do not wait to see if the symptoms will go away. Get medical help right away. Call your local emergency services (911 in the [place] Do not drive yourself to the hospital.   This information is not intended to replace advice given to you by your health care provider. Make sure you discuss any questions you have with your health care provider.   Document Released: 09/14/2005 Document Revised: 12/26/2014 Document Reviewed: 07/11/2014 Elsevier Interactive Patient Education [employer].

## 2016-06-10 NOTE — ED Provider Notes (Signed)
CSN: [id number]     Arrival date & time 06/09/16  2117 History  By signing my name below, I, Dora Sims, attest that this documentation has been prepared under the direction and in the presence of physician practitioner, Merryl Hacker, MD. Electronically Signed: Dora Sims, Scribe. 06/10/2016. 1:19 AM.   Chief Complaint  Patient presents with  . Chest Pain    The history is provided by the patient. No language interpreter was used.     HPI Comments: Bandy Torrens is a 60 y.o. female with PMHx of HLD who presents to the Emergency Department complaining of sudden onset, intermittent, worsening, left-sided chest pain that she describes as a "pinch" sensation for the last couple of days. She notes no alleviating or exacerbating factors. Pt endorses intermittent, 9/10, right flank pain as well as burning, generalized abdominal pain radiating to her lower back for the last several days. She had a regular appointment with her PCP on 06/06/16 and states her cholesterol was elevated; she was instructed to start taking her HLD medication regularly. Pt states that her HLD medication causes heartburn and abdominal pain. She reports that she feels fine currently. She denies hematuria, dysuria, fever, SOB, cough, or any other associated symptoms.  Past Medical History  Diagnosis Date  . High cholesterol   . Seasonal allergies   . Sleep apnea     uses c-pap  . Allergy   . Depression    Past Surgical History  Procedure Laterality Date  . Hand surgery Bilateral 2007    Carpal tunnel   . Tummy tuck     Family History  Problem Relation Age of Onset  . Migraines Neg Hx   . Colon cancer Neg Hx   . Esophageal cancer Neg Hx   . Rectal cancer Neg Hx   . Stomach cancer Neg Hx   . Heart disease Mother   . Stroke Father   . Diabetes Father    Social History  Substance Use Topics  . Smoking status: Former Research scientist (life sciences)  . Smokeless tobacco: Never Used     Comment: Quit in 2006  . Alcohol Use:  No   OB History    No data available     Review of Systems  Constitutional: Negative for fever.  Respiratory: Negative for cough and shortness of breath.   Cardiovascular: Positive for chest pain.  Gastrointestinal: Positive for abdominal pain.  Genitourinary: Positive for flank pain (right). Negative for dysuria and hematuria.  Musculoskeletal: Positive for back pain (lower).  All other systems reviewed and are negative.  Allergies  Review of patient's allergies indicates no known allergies.  Home Medications   Prior to Admission medications   Medication Sig Start Date End Date Taking? Authorizing Provider  pravastatin (PRAVACHOL) 20 MG tablet Take 20 mg by mouth daily.   Yes Historical Provider, MD  ranitidine (ZANTAC) 150 MG tablet Take 150 mg by mouth daily as needed for heartburn.   Yes Historical Provider, MD  omeprazole (PRILOSEC) 20 MG capsule Take 1 capsule (20 mg total) by mouth daily. 06/10/16   Merryl Hacker, MD   BP 137/75 mmHg  Pulse 64  Temp(Src) 98.7 F (37.1 C) (Oral)  Resp 15  SpO2 100% Physical Exam  Constitutional: She is oriented to person, place, and time. She appears well-developed and well-nourished.  Obese  HENT:  Head: Normocephalic and atraumatic.  Cardiovascular: Normal rate, regular rhythm and normal heart sounds.   Pulmonary/Chest: Effort normal. No respiratory distress. She has no wheezes.  She exhibits tenderness.  Abdominal: Soft. Bowel sounds are normal. There is no tenderness. There is no rebound.  Genitourinary:  No CVA tenderness  Neurological: She is alert and oriented to person, place, and time.  Skin: Skin is warm and dry.  Psychiatric: She has a normal mood and affect.  Nursing note and vitals reviewed.   ED Course  Procedures (including critical care time)  DIAGNOSTIC STUDIES: Oxygen Saturation is 100% on RA, normal by my interpretation.    COORDINATION OF CARE: 1:19 AM Discussed treatment plan with pt at bedside and  pt agreed to plan.  Labs Review Labs Reviewed  HEPATIC FUNCTION PANEL - Abnormal; Notable for the following:    Bilirubin, Direct <0.1 (*)    All other components within normal limits  BASIC METABOLIC PANEL  CBC  LIPASE, BLOOD  URINALYSIS, ROUTINE W REFLEX MICROSCOPIC (NOT AT [facility])  Randolm Idol, ED  Randolm Idol, ED    Imaging Review Dg Chest 2 View  06/09/2016  CLINICAL DATA:  CHEST PAIN X 2 DAYS, NONSMOKER. NO OTHER CHEST COMPLAINTS. EXAM: CHEST - 2 VIEW COMPARISON:  09/23/2010 FINDINGS: Mild interstitial prominence as before. No focal airspace disease or overt edema. Heart size upper limits normal. No effusion. Spurring in the mid thoracic spine. IMPRESSION: Chronic interstitial changes as before.  No acute disease. Electronically Signed   By: Lucrezia Europe M.D.   On: 06/09/2016 22:40   I have personally reviewed and evaluated these images and lab results as part of my medical decision-making.   EKG Interpretation   Date/Time:  Thursday June 09 2016 21:26:56 EDT Ventricular Rate:  77 PR Interval:  166 QRS Duration: 92 QT Interval:  384 QTC Calculation: 434 R Axis:   40 Text Interpretation:  Normal sinus rhythm Normal ECG Confirmed by Marise Knapper   MD, Mj Willis ([id number]) on 06/10/2016 1:38:16 AM      MDM   Final diagnoses:  Other chest pain  Flank pain    Patient presents with multiple complaints including chest pain, flank pain, and thumb pain. Patient also reports worsening epigastric and back pain after taking her lipid medications. She states that this is common for her. She is nontoxic. EKG is normal. Troponin 2 is negative. Pain is reproducible on exam. She currently is pain-free and without complaint. Lab workup including hepatic function panel, urinalysis, lipase are all within normal limits. Will start patient on omeprazole. Follow-up with primary physician and cardiology.  After history, exam, and medical workup I feel the patient has been appropriately  medically screened and is safe for discharge home. Pertinent diagnoses were discussed with the patient. Patient was given return precautions.  I personally performed the services described in this documentation, which was scribed in my presence. The recorded information has been reviewed and is accurate.   Merryl Hacker, MD 06/10/16 [phone]

## 2016-07-07 ENCOUNTER — Other Ambulatory Visit: Payer: Self-pay | Admitting: Otolaryngology

## 2016-07-26 ENCOUNTER — Encounter (HOSPITAL_COMMUNITY): Payer: Self-pay

## 2016-07-26 ENCOUNTER — Encounter (HOSPITAL_COMMUNITY)
Admission: RE | Admit: 2016-07-26 | Discharge: 2016-07-26 | Disposition: A | Discharge status: Home / Self Care | Payer: Medicare Other | Source: Ambulatory Visit | Attending: Otolaryngology | Admitting: Otolaryngology

## 2016-07-26 DIAGNOSIS — Z6841 Body Mass Index (BMI) 40.0 and over, adult: Secondary | ICD-10-CM | POA: Diagnosis not present

## 2016-07-26 DIAGNOSIS — Z87891 Personal history of nicotine dependence: Secondary | ICD-10-CM | POA: Diagnosis not present

## 2016-07-26 DIAGNOSIS — K219 Gastro-esophageal reflux disease without esophagitis: Secondary | ICD-10-CM | POA: Diagnosis not present

## 2016-07-26 DIAGNOSIS — G4733 Obstructive sleep apnea (adult) (pediatric): Secondary | ICD-10-CM | POA: Diagnosis present

## 2016-07-26 LAB — CBC
HEMATOCRIT: 39.4 % (ref 36.0–46.0)
HEMOGLOBIN: 12.5 g/dL (ref 12.0–15.0)
MCH: 29.8 pg (ref 26.0–34.0)
MCHC: 31.7 g/dL (ref 30.0–36.0)
MCV: 93.8 fL (ref 78.0–100.0)
Platelets: 258 10*3/uL (ref 150–400)
RBC: 4.2 MIL/uL (ref 3.87–5.11)
RDW: 13.8 % (ref 11.5–15.5)
WBC: 7.3 10*3/uL (ref 4.0–10.5)

## 2016-07-26 NOTE — Progress Notes (Signed)
PCP - Dr. Precious Haws Cardiologist - denies  EKG - 06/09/16 CXR - 06/09/16  Echo/Stress test/Cardiac Cath - denies  Patient denies chest pain and shortness of breath at PAT appointment.    Patient states that she speaks both [place] and [place] and that she does not need an interpreter.

## 2016-07-26 NOTE — Pre-Procedure Instructions (Signed)
    Megan Mcintyre  07/26/2016      [facility] SN:[id number] - [address], [address] [place] [address] Phone: [phone] Fax: [phone]    Your procedure is scheduled on Aug. 11  Report to [facility] Admitting at 9:00 A.M.  Call this number if you have problems the morning of surgery:  [phone]              Any questions prior to surgery call [phone] Monday-Friday 8am-4pm   Remember:  Do not eat food or drink liquids after midnight on  Aug. 10  Take these medicines the morning of surgery with A SIP OF WATER: ranitidine (zantac) if needed   Do not wear jewelry, make-up or nail polish.  Do not wear lotions, powders, or perfumes.  You may not wear deoderant.  Do not shave 48 hours prior to surgery.  Men may shave face and neck.  Do not bring valuables to the hospital.  [facility] is not responsible for any belongings or valuables.  Contacts, dentures or bridgework may not be worn into surgery.  Leave your suitcase in the car.  After surgery it may be brought to your room.  For patients admitted to the hospital, discharge time will be determined by your treatment team.  Patients discharged the day of surgery will not be allowed to drive home.   Name and phone number of your driver:   Special instructions:  Review preparing for surgery  Please read over the following fact sheets that you were given.

## 2016-07-29 ENCOUNTER — Observation Stay (HOSPITAL_COMMUNITY)
Admission: RE | Admit: 2016-07-29 | Discharge: 2016-07-30 | Disposition: A | Discharge status: Home / Self Care | Payer: Medicare Other | Source: Ambulatory Visit | Attending: Otolaryngology | Admitting: Otolaryngology

## 2016-07-29 ENCOUNTER — Ambulatory Visit (HOSPITAL_COMMUNITY): Payer: Medicare Other | Admitting: Certified Registered Nurse Anesthetist

## 2016-07-29 ENCOUNTER — Encounter (HOSPITAL_COMMUNITY): Payer: Self-pay | Admitting: Certified Registered Nurse Anesthetist

## 2016-07-29 ENCOUNTER — Encounter (HOSPITAL_COMMUNITY)
Admission: RE | Disposition: A | Discharge status: Home / Self Care | Payer: Self-pay | Source: Ambulatory Visit | Attending: Otolaryngology

## 2016-07-29 DIAGNOSIS — K219 Gastro-esophageal reflux disease without esophagitis: Secondary | ICD-10-CM | POA: Diagnosis not present

## 2016-07-29 DIAGNOSIS — Z6841 Body Mass Index (BMI) 40.0 and over, adult: Secondary | ICD-10-CM | POA: Insufficient documentation

## 2016-07-29 DIAGNOSIS — Z87891 Personal history of nicotine dependence: Secondary | ICD-10-CM | POA: Insufficient documentation

## 2016-07-29 DIAGNOSIS — G4733 Obstructive sleep apnea (adult) (pediatric): Secondary | ICD-10-CM | POA: Diagnosis not present

## 2016-07-29 HISTORY — PX: UVULOPALATOPHARYNGOPLASTY (UPPP)/TONSILLECTOMY/SEPTOPLASTY: SHX6164

## 2016-07-29 LAB — MRSA PCR SCREENING: MRSA by PCR: NEGATIVE

## 2016-07-29 SURGERY — SEPTOPLASTY, NOSE, WITH TONSILLECTOMY AND UPPP
Anesthesia: General | Site: Mouth

## 2016-07-29 MED ORDER — FENTANYL CITRATE (PF) 100 MCG/2ML IJ SOLN
INTRAMUSCULAR | Status: AC
  Administered 2016-07-29: 50 ug via INTRAVENOUS
  Filled 2016-07-29: qty 2

## 2016-07-29 MED ORDER — LIDOCAINE-EPINEPHRINE 1 %-1:100000 IJ SOLN
INTRAMUSCULAR | Status: DC | PRN
  Administered 2016-07-29: 4 mL

## 2016-07-29 MED ORDER — CHLORHEXIDINE GLUCONATE 0.12 % MT SOLN
15.0000 mL | Freq: Two times a day (BID) | OROMUCOSAL | Status: DC
  Administered 2016-07-29 – 2016-07-30 (×2): 15 mL via OROMUCOSAL
  Filled 2016-07-29: qty 15

## 2016-07-29 MED ORDER — CETYLPYRIDINIUM CHLORIDE 0.05 % MT LIQD: 7.0000 mL | Freq: Two times a day (BID) | OROMUCOSAL | Status: DC

## 2016-07-29 MED ORDER — MIDAZOLAM HCL 2 MG/2ML IJ SOLN
INTRAMUSCULAR | Status: AC
  Filled 2016-07-29: qty 2

## 2016-07-29 MED ORDER — MORPHINE SULFATE (PF) 2 MG/ML IV SOLN: 2.0000 mg | INTRAVENOUS | Status: DC | PRN

## 2016-07-29 MED ORDER — ROCURONIUM BROMIDE 100 MG/10ML IV SOLN
INTRAVENOUS | Status: DC | PRN
  Administered 2016-07-29: 50 mg via INTRAVENOUS

## 2016-07-29 MED ORDER — ONDANSETRON HCL 4 MG/2ML IJ SOLN
INTRAMUSCULAR | Status: DC | PRN
  Administered 2016-07-29: 4 mg via INTRAVENOUS

## 2016-07-29 MED ORDER — FENTANYL CITRATE (PF) 250 MCG/5ML IJ SOLN
INTRAMUSCULAR | Status: AC
  Filled 2016-07-29: qty 5

## 2016-07-29 MED ORDER — DEXAMETHASONE SODIUM PHOSPHATE 10 MG/ML IJ SOLN
INTRAMUSCULAR | Status: DC | PRN
  Administered 2016-07-29: 10 mg via INTRAVENOUS

## 2016-07-29 MED ORDER — LIDOCAINE HCL (CARDIAC) 20 MG/ML IV SOLN
INTRAVENOUS | Status: DC | PRN
  Administered 2016-07-29 (×2): 80 mg via INTRAVENOUS

## 2016-07-29 MED ORDER — ACETAMINOPHEN 160 MG/5ML PO SOLN
325.0000 mg | ORAL | Status: DC | PRN
  Filled 2016-07-29: qty 20.3

## 2016-07-29 MED ORDER — SUGAMMADEX SODIUM 200 MG/2ML IV SOLN
INTRAVENOUS | Status: AC
  Filled 2016-07-29: qty 2

## 2016-07-29 MED ORDER — SUGAMMADEX SODIUM 500 MG/5ML IV SOLN
INTRAVENOUS | Status: DC | PRN
  Administered 2016-07-29: 400 mg via INTRAVENOUS

## 2016-07-29 MED ORDER — CEFAZOLIN SODIUM 1 G IJ SOLR
INTRAMUSCULAR | Status: DC | PRN
  Administered 2016-07-29: 2 g via INTRAMUSCULAR

## 2016-07-29 MED ORDER — NEOSTIGMINE METHYLSULFATE 5 MG/5ML IV SOSY
PREFILLED_SYRINGE | INTRAVENOUS | Status: AC
  Filled 2016-07-29: qty 5

## 2016-07-29 MED ORDER — ROCURONIUM BROMIDE 10 MG/ML (PF) SYRINGE
PREFILLED_SYRINGE | INTRAVENOUS | Status: AC
  Filled 2016-07-29: qty 20

## 2016-07-29 MED ORDER — OXYCODONE HCL 5 MG PO TABS: 5.0000 mg | ORAL_TABLET | Freq: Once | ORAL | Status: DC | PRN

## 2016-07-29 MED ORDER — PANTOPRAZOLE SODIUM 40 MG PO TBEC
40.0000 mg | DELAYED_RELEASE_TABLET | Freq: Every day | ORAL | Status: DC
  Administered 2016-07-30: 40 mg via ORAL
  Filled 2016-07-29: qty 1

## 2016-07-29 MED ORDER — HYDROCODONE-ACETAMINOPHEN 7.5-325 MG/15ML PO SOLN
10.0000 mL | ORAL | Status: DC | PRN
  Administered 2016-07-29 – 2016-07-30 (×2): 15 mL via ORAL
  Filled 2016-07-29 (×3): qty 15

## 2016-07-29 MED ORDER — 0.9 % SODIUM CHLORIDE (POUR BTL) OPTIME
TOPICAL | Status: DC | PRN
  Administered 2016-07-29: 1000 mL

## 2016-07-29 MED ORDER — PROPOFOL 10 MG/ML IV BOLUS
INTRAVENOUS | Status: AC
  Filled 2016-07-29: qty 20

## 2016-07-29 MED ORDER — LIDOCAINE-EPINEPHRINE 1 %-1:100000 IJ SOLN
INTRAMUSCULAR | Status: AC
  Filled 2016-07-29: qty 1

## 2016-07-29 MED ORDER — GLYCOPYRROLATE 0.2 MG/ML IV SOSY
PREFILLED_SYRINGE | INTRAVENOUS | Status: AC
  Filled 2016-07-29: qty 3

## 2016-07-29 MED ORDER — PHENYLEPHRINE HCL 0.5 % NA SOLN
NASAL | Status: DC | PRN
  Administered 2016-07-29: 2 [drp]

## 2016-07-29 MED ORDER — SUCCINYLCHOLINE CHLORIDE 20 MG/ML IJ SOLN
INTRAMUSCULAR | Status: DC | PRN
  Administered 2016-07-29: 100 mg via INTRAVENOUS

## 2016-07-29 MED ORDER — FENTANYL CITRATE (PF) 100 MCG/2ML IJ SOLN
25.0000 ug | INTRAMUSCULAR | Status: DC | PRN
  Administered 2016-07-29 (×3): 50 ug via INTRAVENOUS

## 2016-07-29 MED ORDER — MIDAZOLAM HCL 5 MG/5ML IJ SOLN
INTRAMUSCULAR | Status: DC | PRN
  Administered 2016-07-29: 2 mg via INTRAVENOUS

## 2016-07-29 MED ORDER — KCL IN DEXTROSE-NACL 20-5-0.45 MEQ/L-%-% IV SOLN
INTRAVENOUS | Status: DC
  Administered 2016-07-29 – 2016-07-30 (×2): via INTRAVENOUS
  Filled 2016-07-29 (×3): qty 1000

## 2016-07-29 MED ORDER — DEXAMETHASONE SODIUM PHOSPHATE 10 MG/ML IJ SOLN
INTRAMUSCULAR | Status: AC
  Filled 2016-07-29: qty 1

## 2016-07-29 MED ORDER — LIDOCAINE 2% (20 MG/ML) 5 ML SYRINGE
INTRAMUSCULAR | Status: AC
  Filled 2016-07-29: qty 5

## 2016-07-29 MED ORDER — ACETAMINOPHEN 325 MG PO TABS: 325.0000 mg | ORAL_TABLET | ORAL | Status: DC | PRN

## 2016-07-29 MED ORDER — OXYCODONE HCL 5 MG/5ML PO SOLN: 5.0000 mg | Freq: Once | ORAL | Status: DC | PRN

## 2016-07-29 MED ORDER — ONDANSETRON HCL 4 MG/2ML IJ SOLN
4.0000 mg | Freq: Three times a day (TID) | INTRAMUSCULAR | Status: DC | PRN
  Administered 2016-07-29: 4 mg via INTRAVENOUS
  Filled 2016-07-29: qty 2

## 2016-07-29 MED ORDER — FENTANYL CITRATE (PF) 100 MCG/2ML IJ SOLN
25.0000 ug | INTRAMUSCULAR | Status: DC | PRN
  Administered 2016-07-29 (×4): 25 ug via INTRAVENOUS
  Administered 2016-07-29: 50 ug via INTRAVENOUS

## 2016-07-29 MED ORDER — KCL IN DEXTROSE-NACL 20-5-0.45 MEQ/L-%-% IV SOLN
INTRAVENOUS | Status: AC
  Filled 2016-07-29: qty 1000

## 2016-07-29 MED ORDER — PROMETHAZINE HCL 25 MG PO TABS
25.0000 mg | ORAL_TABLET | Freq: Four times a day (QID) | ORAL | Status: DC | PRN
  Administered 2016-07-29: 25 mg via ORAL
  Filled 2016-07-29: qty 1

## 2016-07-29 MED ORDER — FENTANYL CITRATE (PF) 100 MCG/2ML IJ SOLN
INTRAMUSCULAR | Status: AC
  Administered 2016-07-29: 25 ug via INTRAVENOUS
  Filled 2016-07-29: qty 2

## 2016-07-29 MED ORDER — ARTIFICIAL TEARS OP OINT
TOPICAL_OINTMENT | OPHTHALMIC | Status: AC
  Filled 2016-07-29: qty 3.5

## 2016-07-29 MED ORDER — HYDROCODONE-ACETAMINOPHEN 7.5-325 MG/15ML PO SOLN: 15.0000 mL | Freq: Four times a day (QID) | ORAL | 0 refills | Status: DC | PRN

## 2016-07-29 MED ORDER — SUGAMMADEX SODIUM 500 MG/5ML IV SOLN
INTRAVENOUS | Status: AC
  Filled 2016-07-29: qty 5

## 2016-07-29 MED ORDER — LACTATED RINGERS IV SOLN
INTRAVENOUS | Status: DC | PRN
  Administered 2016-07-29: 10:00:00 via INTRAVENOUS

## 2016-07-29 MED ORDER — CEFAZOLIN SODIUM 1 G IJ SOLR
INTRAMUSCULAR | Status: AC
  Filled 2016-07-29: qty 20

## 2016-07-29 MED ORDER — PROMETHAZINE HCL 25 MG RE SUPP: 25.0000 mg | Freq: Four times a day (QID) | RECTAL | Status: DC | PRN

## 2016-07-29 MED ORDER — ONDANSETRON HCL 4 MG/2ML IJ SOLN
INTRAMUSCULAR | Status: AC
  Filled 2016-07-29: qty 2

## 2016-07-29 MED ORDER — SUCCINYLCHOLINE CHLORIDE 200 MG/10ML IV SOSY
PREFILLED_SYRINGE | INTRAVENOUS | Status: AC
  Filled 2016-07-29: qty 10

## 2016-07-29 MED ORDER — PROPOFOL 10 MG/ML IV BOLUS
INTRAVENOUS | Status: DC | PRN
  Administered 2016-07-29: 150 mg via INTRAVENOUS

## 2016-07-29 MED ORDER — FENTANYL CITRATE (PF) 100 MCG/2ML IJ SOLN
INTRAMUSCULAR | Status: DC | PRN
  Administered 2016-07-29 (×7): 50 ug via INTRAVENOUS

## 2016-07-29 MED ORDER — OXYMETAZOLINE HCL 0.05 % NA SOLN
NASAL | Status: AC
  Filled 2016-07-29: qty 15

## 2016-07-29 SURGICAL SUPPLY — 36 items
BLADE SURG 15 STRL LF DISP TIS (BLADE) ×1 IMPLANT
BLADE SURG 15 STRL SS (BLADE) ×2
CANISTER SUCTION 2500CC (MISCELLANEOUS) ×3 IMPLANT
CLEANER TIP ELECTROSURG 2X2 (MISCELLANEOUS) ×3 IMPLANT
COAGULATOR SUCT 6 FR SWTCH (ELECTROSURGICAL) ×1
COAGULATOR SUCT SWTCH 10FR 6 (ELECTROSURGICAL) ×2 IMPLANT
CRADLE DONUT ADULT HEAD (MISCELLANEOUS) ×3 IMPLANT
ELECT COATED BLADE 2.86 ST (ELECTRODE) ×3 IMPLANT
ELECT REM PT RETURN 9FT ADLT (ELECTROSURGICAL) ×3
ELECTRODE REM PT RTRN 9FT ADLT (ELECTROSURGICAL) ×1 IMPLANT
GLOVE BIO SURGEON STRL SZ7.5 (GLOVE) ×3 IMPLANT
GLOVE BIOGEL PI IND STRL 6.5 (GLOVE) ×1 IMPLANT
GLOVE BIOGEL PI INDICATOR 6.5 (GLOVE) ×2
GOWN STRL REUS W/ TWL LRG LVL3 (GOWN DISPOSABLE) ×3 IMPLANT
GOWN STRL REUS W/TWL LRG LVL3 (GOWN DISPOSABLE) ×6
KIT BASIN OR (CUSTOM PROCEDURE TRAY) ×3 IMPLANT
KIT ROOM TURNOVER OR (KITS) ×3 IMPLANT
KIT SUCTION CATH 14FR (SUCTIONS) ×3 IMPLANT
NEEDLE HYPO 25GX1X1/2 BEV (NEEDLE) ×3 IMPLANT
NEEDLE SPNL 25GX3.5 QUINCKE BL (NEEDLE) IMPLANT
NS IRRIG 1000ML POUR BTL (IV SOLUTION) ×3 IMPLANT
PAD ARMBOARD 7.5X6 YLW CONV (MISCELLANEOUS) ×6 IMPLANT
PATTIES SURGICAL .5 X3 (DISPOSABLE) ×3 IMPLANT
PENCIL BUTTON HOLSTER BLD 10FT (ELECTRODE) ×3 IMPLANT
SPECIMEN JAR SMALL (MISCELLANEOUS) ×6 IMPLANT
SPONGE TONSIL 1 RF SGL (DISPOSABLE) ×3 IMPLANT
SUT ETHIBOND 2 V 37 (SUTURE) IMPLANT
SUT ETHILON 3 0 FSL (SUTURE) IMPLANT
SUT ETHILON 5 0 P 3 18 (SUTURE)
SUT NYLON ETHILON 5-0 P-3 1X18 (SUTURE) IMPLANT
SUT VIC AB 3-0 SH 27 (SUTURE) ×10
SUT VIC AB 3-0 SH 27X BRD (SUTURE) ×5 IMPLANT
SUT VIC AB 4-0 SH 18 (SUTURE) ×3 IMPLANT
TOWEL OR 17X24 6PK STRL BLUE (TOWEL DISPOSABLE) ×3 IMPLANT
TRAY ENT MC OR (CUSTOM PROCEDURE TRAY) ×3 IMPLANT
TUBE SALEM SUMP 14F W/ARV (TUBING) ×3 IMPLANT

## 2016-07-29 NOTE — Anesthesia Procedure Notes (Signed)
Procedure Name: Intubation Date/Time: 07/29/2016 10:20 AM Performed by: Willeen Cass P Pre-anesthesia Checklist: Patient identified, Emergency Drugs available, Suction available, Patient being monitored and Timeout performed Patient Re-evaluated:Patient Re-evaluated prior to inductionOxygen Delivery Method: Circle system utilized Preoxygenation: Pre-oxygenation with 100% oxygen Intubation Type: IV induction Ventilation: Mask ventilation with difficulty Laryngoscope size: Small adult Glidescope (green) Grade View: Grade I Tube type: Oral Tube size: 7.0 mm Number of attempts: 1 Airway Equipment and Method: Video-laryngoscopy and Rigid stylet Placement Confirmation: ETT inserted through vocal cords under direct vision,  positive ETCO2 and breath sounds checked- equal and bilateral Secured at: 22 cm Tube secured with: Tape Dental Injury: Teeth and Oropharynx as per pre-operative assessment

## 2016-07-29 NOTE — Brief Op Note (Signed)
07/29/2016  10:59 AM  PATIENT:  Megan Mcintyre  60 y.o. female  PRE-OPERATIVE DIAGNOSIS:  SLEEP APNEA  POST-OPERATIVE DIAGNOSIS:  SLEEP APNEA  PROCEDURE:  Procedure(s): UVULOPALATOPHARYNGOPLASTY (UPPP)/TONSILLECTOMY (N/A)  SURGEON:  Surgeon(s) and Role:    * Melida Quitter, MD - Primary  PHYSICIAN ASSISTANT:   ASSISTANTS: none   ANESTHESIA:   general  EBL:  Total I/O In: 600 [I.V.:600] Out: -   BLOOD ADMINISTERED:none  DRAINS: none   LOCAL MEDICATIONS USED:  LIDOCAINE   SPECIMEN:  Source of Specimen:  right and left tonsils and uvula  DISPOSITION OF SPECIMEN:  PATHOLOGY  COUNTS:  YES  TOURNIQUET:  * No tourniquets in log *  DICTATION: .Other Dictation: Dictation Number [id number]  PLAN OF CARE: Admit for overnight observation  PATIENT DISPOSITION:  PACU - hemodynamically stable.   Delay start of Pharmacological VTE agent (>24hrs) due to surgical blood loss or risk of bleeding: yes

## 2016-07-29 NOTE — Anesthesia Postprocedure Evaluation (Signed)
Anesthesia Post Note  Patient: Megan Mcintyre  Procedure(s) Performed: Procedure(s) (LRB): UVULOPALATOPHARYNGOPLASTY (UPPP)/TONSILLECTOMY (N/A)  Patient location during evaluation: PACU Anesthesia Type: General Level of consciousness: sedated and patient cooperative Pain management: pain level controlled Vital Signs Assessment: post-procedure vital signs reviewed and stable Respiratory status: spontaneous breathing Cardiovascular status: stable Anesthetic complications: no    Last Vitals:  Vitals:   07/29/16 1431 07/29/16 1445  BP: (!) 101/50 (!) 104/55  Pulse: 60 61  Resp: 12 13  Temp:      Last Pain:  Vitals:   07/29/16 1510  TempSrc:   PainSc: [place]

## 2016-07-29 NOTE — Transfer of Care (Signed)
Immediate Anesthesia Transfer of Care Note  Patient: Megan Mcintyre  Procedure(s) Performed: Procedure(s): UVULOPALATOPHARYNGOPLASTY (UPPP)/TONSILLECTOMY (N/A)  Patient Location: PACU  Anesthesia Type:General  Level of Consciousness: awake, oriented, patient cooperative and lethargic  Airway & Oxygen Therapy: Patient Spontanous Breathing and Patient connected to face mask oxygen  Post-op Assessment: Report given to RN and Post -op Vital signs reviewed and stable  Post vital signs: Reviewed and stable  Last Vitals:  Vitals:   07/29/16 0939 07/29/16 1112  BP: (!) 119/59   Pulse: 62   Resp: 20   Temp: 37.2 C (P) 36.1 C    Last Pain:  Vitals:   07/29/16 0939  TempSrc: Oral         Complications: No apparent anesthesia complications

## 2016-07-29 NOTE — Anesthesia Preprocedure Evaluation (Addendum)
Anesthesia Evaluation  Patient identified by MRN, date of birth, ID band Patient awake    Reviewed: Allergy & Precautions, NPO status , Patient's Chart, lab work & pertinent test results  History of Anesthesia Complications Negative for: history of anesthetic complications  Airway Mallampati: III  TM Distance: >3 FB Neck ROM: Full    Dental  (+) Teeth Intact, Missing,    Pulmonary sleep apnea , former smoker,    breath sounds clear to auscultation       Cardiovascular negative cardio ROS   Rhythm:Regular     Neuro/Psych  Headaches, PSYCHIATRIC DISORDERS Depression    GI/Hepatic Neg liver ROS, GERD  Medicated and Controlled,  Endo/Other  Morbid obesity  Renal/GU negative Renal ROS     Musculoskeletal negative musculoskeletal ROS (+)   Abdominal   Peds  Hematology negative hematology ROS (+)   Anesthesia Other Findings   Reproductive/Obstetrics                            Anesthesia Physical Anesthesia Plan  ASA: II  Anesthesia Plan: General   Post-op Pain Management:    Induction: Intravenous  Airway Management Planned: Oral ETT  Additional Equipment: None  Intra-op Plan:   Post-operative Plan: Extubation in OR  Informed Consent: I have reviewed the patients History and Physical, chart, labs and discussed the procedure including the risks, benefits and alternatives for the proposed anesthesia with the patient or authorized representative who has indicated his/her understanding and acceptance.   Dental advisory given  Plan Discussed with: CRNA and Surgeon  Anesthesia Plan Comments:         Anesthesia Quick Evaluation

## 2016-07-29 NOTE — Op Note (Signed)
NAMEMarland Kitchen  Megan Mcintyre, Megan Mcintyre NO.:  [id number]  MEDICAL RECORD NO.:  [id number]  LOCATION:  [facility]                         FACILITY:  [facility]  PHYSICIAN:  Onnie Graham, MD     DATE OF BIRTH:  Sep 11, 1956  DATE OF PROCEDURE:  07/29/2016 DATE OF DISCHARGE:                              OPERATIVE REPORT   PREOPERATIVE DIAGNOSIS:  Severe obstructive sleep apnea.  POSTOPERATIVE DIAGNOSIS:  Severe obstructive sleep apnea.  PROCEDURE:  Uvulopalatopharyngoplasty with tonsillectomy.  SURGEON:  Onnie Graham, MD  ANESTHESIA:  General endotracheal anesthesia.  COMPLICATIONS:  None.  INDICATION:  The patient is a 60 year old female with severe obstructive sleep apnea who previously was treated with CPAP with good effect but has found the CPAP to become very difficult to tolerate where she is unable to sleep.  She was found to have enlarged tonsils and oropharyngeal obstruction, presents now for surgical management.  FINDINGS:  Tonsils are 3+.  The tonsils were sent separately for pathology along with the uvula.  DESCRIPTION OF PROCEDURE:  The patient was identified in the holding room and informed consent having been obtained including discussion of risks, benefits, alternatives, the patient was brought to the operative suite and put on the operative table in supine position.  Anesthesia was induced.  The patient was intubated by the Anesthesia team without difficulty.  The patient was given intravenous antibiotics and steroids during the case.  The eyes were taped closed and bed was turned 90 degrees from anesthesia.  A head wrap was placed around the patient's head, and a [employer] retractor inserted in the mouth and opened to view the oropharynx.  This was placed in suspension on Mayo stand.  The midline and the uvula were marked with a Bovie electrocautery at the site of the planned horizontal incision.  This was determined in the preoperative area where her throat was  examined while elevating the palate and a conservative position was estimated.  The distal soft palate was then injected with 1% lidocaine with 1:100,000 epinephrine. The right tonsil was grasped with a curved Allis and retracted medially while a curvilinear incision was made along the anterior tonsillar pillar using Bovie cautery at a setting of 20.  Dissection continued in the subcapsular plane until the tonsil was removed.  The same procedure was then carried on the left side.  Tonsils were sent separately for pathology.  A horizontal incision was then made from the superior extent of the tonsillar fossa across the soft palate using a 15 blade scalpel to the other side.  The submucosal tissues were then divided with Bovie electrocautery including the uvula muscle.  This was scabbed in an inferior direction and the posterior soft palate mucosa was then incised with a 15 blade scalpel.  The uvula was passed for pathology as well. Bleeding was then controlled with suction cautery.  The lateral superior tonsillar fossae were then back cut with the Bovie electrocautery. Bleeding was encountered on each site, was controlled with suction cautery.  The posterior tonsillar pillar was then sutured to the anterior tonsillar pillar starting at the corner where the back cut was made using a 3-0 Vicryl suture in a simple interrupted  fashion.  The posterior soft palate mucosa was then sutured to the anterior mucosa as well using the same suture technique.  This resulted in a squared oropharynx.  At this point, the nose and throat were copiously irrigated with saline and a flexible catheter was passed down the esophagus, suctioned out the stomach and esophagus.  The retractor was taken out of suspension and removed from the patient's mouth.  She was turned back to Anesthesia, awakened, was extubated and moved to recovery room in stable condition.     Onnie Graham, MD     DDB/MEDQ  D:   07/29/2016  T:  07/29/2016  Job:  [id number]  cc:   __________

## 2016-07-29 NOTE — H&P (Signed)
Megan Mcintyre is an 60 y.o. female.   Chief Complaint: sleep apnea HPI: 60 year old female with severe sleep apnea who cannot tolerate CPAP. She presents for surgical management.  Past Medical History:  Diagnosis Date  . Allergy   . Depression   . High cholesterol   . Seasonal allergies   . Sleep apnea    uses c-pap    Past Surgical History:  Procedure Laterality Date  . HAND SURGERY Bilateral 2007   Carpal tunnel   . tummy tuck      Family History  Problem Relation Age of Onset  . Heart disease Mother   . Stroke Father   . Diabetes Father   . Migraines Neg Hx   . Colon cancer Neg Hx   . Esophageal cancer Neg Hx   . Rectal cancer Neg Hx   . Stomach cancer Neg Hx    Social History:  reports that she has quit smoking. She has never used smokeless tobacco. She reports that she does not drink alcohol or use drugs.  Allergies:  Allergies  Allergen Reactions  . No Known Allergies Other (See Comments)    Medications Prior to Admission  Medication Sig Dispense Refill  . ranitidine (ZANTAC) 150 MG tablet Take 150 mg by mouth daily as needed for heartburn.    Marland Kitchen omeprazole (PRILOSEC) 20 MG capsule Take 1 capsule (20 mg total) by mouth daily. (Patient not taking: Reported on 07/20/2016) 30 capsule 0    No results found for this or any previous visit (from the past 48 hour(s)). No results found.  Review of Systems  All other systems reviewed and are negative.   Blood pressure (!) 119/59, pulse 62, temperature 99 F (37.2 C), temperature source Oral, resp. rate 20, weight 102.1 kg (225 lb), SpO2 99 %. Physical Exam  Constitutional: She is oriented to person, place, and time. She appears well-developed and well-nourished. No distress.  HENT:  Head: Normocephalic and atraumatic.  Right Ear: External ear normal.  Left Ear: External ear normal.  Nose: Nose normal.  Mouth/Throat: Oropharynx is clear and moist.  Tonsils 3+, Friedman class 3  Eyes: Conjunctivae and EOM are  normal. Pupils are equal, round, and reactive to light.  Neck: Normal range of motion. Neck supple.  Cardiovascular: Normal rate.   Respiratory: Effort normal.  Musculoskeletal: Normal range of motion.  Neurological: She is alert and oriented to person, place, and time. No cranial nerve deficit.  Skin: Skin is warm and dry.  Psychiatric: She has a normal mood and affect. Her behavior is normal. Judgment and thought content normal.     Assessment/Plan Severe obstructive sleep apnea To OR for UPPP/tonsillectomy.  Observe overnight after surgery.  Melida Quitter, MD 07/29/2016, 10:03 AM

## 2016-07-30 DIAGNOSIS — G4733 Obstructive sleep apnea (adult) (pediatric): Secondary | ICD-10-CM | POA: Diagnosis not present

## 2016-07-30 MED ORDER — ACETAMINOPHEN 325 MG PO TABS: 650.0000 mg | ORAL_TABLET | Freq: Once | ORAL | Status: DC

## 2016-07-30 NOTE — Discharge Summary (Signed)
Physician Discharge Summary  Patient ID: Megan Mcintyre MRN: [id number] DOB/AGE: 62-May-1957 62 y.o.  Admit date: 07/29/2016 Discharge date: 07/30/2016  Admission Diagnoses:Obstructive sleep apnea  Discharge Diagnoses: same Active Problems:   Sleep apnea, obstructive   Discharged Condition: good  Hospital Course: admitted after UPPP and did well over night. The pain is controlled and she is taking fluids well. No bleeding.No other complaints.Discharge home   Consults: None  Significant Diagnostic Studies: 0  Treatments: UPPP  Discharge Exam: Blood pressure 126/68, pulse 70, temperature 98.5 F (36.9 C), temperature source Oral, resp. rate 16, height 5' 1.5" (1.562 m), weight 105 kg (231 lb 6.4 oz), SpO2 97 %. awake alert. no breathing problems. oc/op without significant swelling and no bleeding. neck without swelling. voice normal. cv- regular l-normal effort and clear. ext- no swelling or pain  Disposition: 01-Home or Self Care  Discharge Instructions    Diet - low sodium heart healthy    Complete by:  As directed   Discharge instructions    Complete by:  As directed   Drink plenty of fluids.  Use pain medicine regularly to help control pain.  Advance diet as able.  Avoid strenuous activity.   Increase activity slowly    Complete by:  As directed      Follow-up Information    BATES, DWIGHT, MD. Schedule an appointment as soon as possible for a visit in 2 weeks.   Specialty:  Otolaryngology Contact information: [address] [place] [address] [phone]           Signed: Melissa Montane 07/30/2016, 11:16 AM

## 2016-08-01 ENCOUNTER — Encounter (HOSPITAL_COMMUNITY): Payer: Self-pay | Admitting: Otolaryngology

## 2016-08-29 ENCOUNTER — Ambulatory Visit: Payer: Medicare Other | Admitting: Neurology

## 2016-09-01 ENCOUNTER — Ambulatory Visit (INDEPENDENT_AMBULATORY_CARE_PROVIDER_SITE_OTHER): Payer: Medicare Other | Admitting: Neurology

## 2016-09-01 ENCOUNTER — Encounter: Payer: Self-pay | Admitting: Neurology

## 2016-09-01 VITALS — BP 120/62 | HR 78 | Resp 16 | Ht 61.5 in | Wt 217.0 lb

## 2016-09-01 DIAGNOSIS — Z9889 Other specified postprocedural states: Secondary | ICD-10-CM | POA: Diagnosis not present

## 2016-09-01 DIAGNOSIS — G4733 Obstructive sleep apnea (adult) (pediatric): Secondary | ICD-10-CM | POA: Diagnosis not present

## 2016-09-01 DIAGNOSIS — E669 Obesity, unspecified: Secondary | ICD-10-CM | POA: Diagnosis not present

## 2016-09-01 NOTE — Patient Instructions (Signed)
We will do sleep study testing for recheck for sleep apnea in the 2nd week of November or so.  I am glad you are feeling better with your sleep.  For headache follow up, please make an appointment with Dr. Jaynee Eagles again.  I will see you back after the sleep study.

## 2016-09-01 NOTE — Progress Notes (Signed)
Subjective:    Patient ID: Megan Mcintyre is a 60 y.o. female.  HPI     Interim history:   Megan Mcintyre is a 60 year old right-handed woman with an underlying medical history of allergies, PUD, hyperlipidemia, obesity and recurrent headaches, who presents for follow-up consultation of her obstructive sleep apnea, on CPAP therapy. The patient is accompanied by her husband today. I last saw her on 02/18/2016, at which time she was noncompliant with her CPAP, she could not tolerate it. She was trying to lose weight, headaches were stable. I referred her to ENT to explore surgical treatment options for sleep apnea. She saw Dr. Redmond Baseman. She has in the interim undergone UPPP and tonsillectomy on 07/29/2016.   Today, 09/01/2016: She reports doing better with her sleep, less snoring per husband, report no significant sleepiness any longer, Dr. Redmond Baseman recommended FU sleep study after 3 month post surgery. Still has HAs, has not seen Dr. Jaynee Eagles again.  Had narcotics for the 1st week post surgery, and had severe pain, but improved and no longer takes the pain meds. Lost about 7-8 lb in the process.   Previously:    Of note, the patient no showed for an appointment on 01/14/2016. I saw her on 07/09/15, at which time she reported doing better with regards to her recurrent headaches and her daytime energy as well as her sleep quality when using CPAP, even though she did not like to use it. He was compliant with treatment and she was planning to take an extended trip to the [place] and was planning to take her machine with her. She was advised to continue treatment regularly.   I reviewed her CPAP compliance data from 01/17/2016 through 02/15/2016 which is a total of 30 days during which time she used her machine only 12 days with percent used days greater than 4 hours at 0%, indicating noncompliance with an average usage of only 40 minutes, residual AHI borderline at 5 per hour, leak borderline with the  95th percentile at 23.3 L/m on a pressure of 9 cm with EPR of 3.    I first met her on 04/28/2015 at the request of Dr. Jaynee Eagles, at which time the patient reported snoring, witnessed breathing pauses while asleep and morning headaches as well as excessive daytime somnolence. I invited her back for a sleep study. She had a split-night sleep study on 05/03/2015 underwent over her test results with her in detail today. Her baseline sleep efficiency was 92.7% with a latency to sleep of 2 minutes and wake after sleep onset of 10.5 minutes with moderate sleep fragmentation noted. She had no significant PLMS, EKG or EEG changes. She had mild to moderate snoring. Her total AHI was 33 per hour. Average oxygen saturation was 93%, nadir was 73% in REM sleep. She was then titrated on CPAP. Sleep efficiency was 95.1%. She had a normal arousal index. Her average oxygen saturation was 94%, nadir was 89%. CPAP was titrated from 5-9 cm, her AHI was reduced to 0 per hour at the final pressure with supine REM sleep achieved. Based on her test results I prescribed CPAP therapy for home use.   I reviewed her CPAP compliance data from 06/08/2015 through 07/07/2015 which is a total of 30 days during which time she used her machine every night with percent used days greater than 4 hours at 77%, indicating good compliance with an average usage of 4 hours and 39 minutes, residual AHI borderline at 5.9 per hour, leak acceptable  with the 95th percentile at 20.7 L/m and a pressure of 9 cm with EPR of 3.    She reports snoring, witnessed breathing pauses while asleep (per husband, who stays in the [place] sleep disruption, morning headaches, and excessive daytime somnolence.     She had a CTH wo contrast on 04/22/15, which was reported as normal.  I personally reviewed the images through the PACS system.   She does not have a set bed time or wake time routine. She says that no matter how early she may go to bed she never wakes  up rested. Often she will stay in bed after waking up because she does not feel like getting out of bed. She has some depressive symptoms but is not on any medication for this. She does not like to take medications. She has a history of gastric ulcer but is currently not on an anti-acid medication. She is quite frustrated. She just wants to feel better. She has 3 grown children. She lives alone. She does not drink any alcohol, she quit smoking about 10 years ago and does not drink any sodas. She has occasional aching in her legs. She has occasional cramps in her legs. She's not sure if she kicks her twitches in her sleep. She does not typically get up to use the bathroom at night. She often wakes up with a headache which is a global aching sensation.   She snores and this can be loud per husband. He has noted breathing pauses while she is asleep. She wakes up with a sense of gasping and panic. She has had shortness of breath after climbing one flight of stairs. She has left knee pain.  Her Past Medical History Is Significant For: Past Medical History:  Diagnosis Date  . Allergy   . Depression   . High cholesterol   . Seasonal allergies   . Sleep apnea    uses c-pap    Her Past Surgical History Is Significant For: Past Surgical History:  Procedure Laterality Date  . HAND SURGERY Bilateral 2007   Carpal tunnel   . tummy tuck    . UVULOPALATOPHARYNGOPLASTY (UPPP)/TONSILLECTOMY/SEPTOPLASTY N/A 07/29/2016   Procedure: UVULOPALATOPHARYNGOPLASTY (UPPP)/TONSILLECTOMY;  Surgeon: Melida Quitter, MD;  Location: [facility] OR;  Service: ENT;  Laterality: N/A;    Her Family History Is Significant For: Family History  Problem Relation Age of Onset  . Heart disease Mother   . Stroke Father   . Diabetes Father   . Migraines Neg Hx   . Colon cancer Neg Hx   . Esophageal cancer Neg Hx   . Rectal cancer Neg Hx   . Stomach cancer Neg Hx     Her Social History Is Significant For: Social History   Social  History  . Marital status: Married    Spouse name: N/A  . Number of children: 3  . Years of education: 12   Occupational History  . Unemployed    Social History Main Topics  . Smoking status: Former Research scientist (life sciences)  . Smokeless tobacco: Never Used     Comment: Quit in 2006  . Alcohol use No  . Drug use: No  . Sexual activity: Not Asked   Other Topics Concern  . None   Social History Narrative   Lives at home by herself.      Caffeine: none    Her Allergies Are:  Allergies  Allergen Reactions  . No Known Allergies Other (See Comments)  :   Her Current  Medications Are:  Outpatient Encounter Prescriptions as of 09/01/2016  Medication Sig  . HYDROcodone-acetaminophen (HYCET) 7.5-325 mg/15 ml solution Take 15 mLs by mouth 4 (four) times daily as needed for moderate pain.  Marland Kitchen omeprazole (PRILOSEC) 20 MG capsule Take 1 capsule (20 mg total) by mouth daily.  . ranitidine (ZANTAC) 150 MG tablet Take 150 mg by mouth daily as needed for heartburn.   No facility-administered encounter medications on file as of 09/01/2016.   :  Review of Systems:  Out of a complete 14 point review of systems, all are reviewed and negative with the exception of these symptoms as listed below: Review of Systems  Neurological:       Patient just recently had surgery for sleep apnea about a month ago. States that she is doing well     Objective:  Neurologic Exam  Physical Exam Physical Examination:   Vitals:   09/01/16 1304  BP: 120/62  Pulse: 78  Resp: 16    General Examination: The patient is a very pleasant 60 y.o. female in no acute distress. She appears well-developed and well-nourished and well groomed. She is obese. She is in good spirits today.  HEENT: Normocephalic, atraumatic, pupils are equal, round and reactive to light and accommodation. Extraocular tracking is good without limitation to gaze excursion or nystagmus noted. Normal smooth pursuit is noted. Hearing is grossly intact. Face  is symmetric with normal facial animation and normal facial sensation. Speech is clear with no dysarthria noted. There is no hypophonia. There is no lip, neck/head, jaw or voice tremor. Neck is supple with full range of passive and active motion. There are no carotid bruits on auscultation. Oropharynx exam reveals: Status post UPPP and tonsillectomy, appears to be healing well.   Chest: Clear to auscultation without wheezing, rhonchi or crackles noted.  Heart: S1+S2+0, regular and normal without murmurs, rubs or gallops noted.   Abdomen: Soft, non-tender and non-distended with normal bowel sounds appreciated on auscultation.  Extremities: There is no pitting edema in the distal lower extremities bilaterally. Pedal pulses are intact.  Skin: Warm and dry without trophic changes noted. There are no varicose veins.  Musculoskeletal: exam reveals no obvious joint deformities, tenderness or joint swelling or erythema.   Neurologically:  Mental status: The patient is awake, alert and oriented in all 4 spheres. Her immediate and remote memory, attention, language skills and fund of knowledge are appropriate. There is no evidence of aphasia, agnosia, apraxia or anomia. Speech is clear with normal prosody and enunciation. Thought process is linear. Mood is normal and affect is increased in intensity.  Cranial nerves II - XII are as described above under HEENT exam. In addition: shoulder shrug is normal with equal shoulder height noted. Motor exam: Normal bulk, strength and tone is noted. There is no drift, tremor or rebound. Romberg is negative. Reflexes are 2+ throughout. Fine motor skills and coordination: intact.  Cerebellar testing: No dysmetria or intention tremor on finger to nose testing. Heel to shin is unremarkable bilaterally. There is no truncal or gait ataxia.  Sensory exam: intact to light touch in the UEs and LEs.  Gait, station and balance: She stands easily. No veering to one side is noted.  No leaning to one side is noted. Posture is age-appropriate and stance is narrow based. Gait shows normal stride length and normal pace. No problems turning are noted. Tandem walk is unremarkable.   Assessment and Plan:   In summary, Megan Mcintyre is a very pleasant 16-year  old female with an underlying medical history of allergies, PUD, hyperlipidemia, obesity and recurrent headaches, who presents for follow-up consultation of her severe obstructive sleep apnea, status post UPPP and tonsillectomy some 5 weeks ago, she feels improved, sleep is better, has lost a little bit of weight in the process, no longer needs pain medication after having a fairly challenging first week she says. She still has headaches, has not seen Dr. Jaynee Eagles since last year and is encouraged to make a follow-up appointment to discuss her headaches. I suggested we bring her back for a diagnostic polysomnogram in mid November and I will see her back after that. We talked about her split night study from 05/03/15 again today and while she had responded rather well to CPAP therapy at a pressure of 9 cm, she did not tolerate it very well, had trouble adjusting to it, and therefore we opted for surgical consultation.  I answered all her questions today and she was in agreement.  I spent 25 minutes in total face-to-face time with the patient, more than 50% of which was spent in counseling and coordination of care, reviewing test results, reviewing medication and discussing or reviewing the diagnosis of OSA, its prognosis and treatment options.

## 2016-09-12 ENCOUNTER — Encounter: Payer: Self-pay | Admitting: Neurology

## 2016-10-03 ENCOUNTER — Ambulatory Visit: Payer: Medicare Other | Admitting: Neurology

## 2016-11-04 ENCOUNTER — Ambulatory Visit (INDEPENDENT_AMBULATORY_CARE_PROVIDER_SITE_OTHER): Payer: Medicare Other | Admitting: Neurology

## 2016-11-04 DIAGNOSIS — G4761 Periodic limb movement disorder: Secondary | ICD-10-CM

## 2016-11-04 DIAGNOSIS — G4720 Circadian rhythm sleep disorder, unspecified type: Secondary | ICD-10-CM

## 2016-11-04 DIAGNOSIS — G4733 Obstructive sleep apnea (adult) (pediatric): Secondary | ICD-10-CM | POA: Diagnosis not present

## 2016-11-18 NOTE — Procedures (Signed)
PATIENT'S NAME:  Megan Mcintyre, Megan Mcintyre DOB:      August 09, 1956      MR#:    [id number]     DATE OF RECORDING: 11/04/2016 REFERRING M.D.:  Tammy Arn Medal, MD Study Performed:   Baseline Polysomnogram HISTORY:  60 year old woman with a history of allergies, PUD, hyperlipidemia, obesity and recurrent headaches, who presents for re-evaluation of her severe obstructive sleep apnea. The patient could not tolerate CPAP in the past and is now s/p UPPP and tonsillectomy (07/29/2016). The patient's weight 217 pounds with a height of 62 (inches), resulting in a BMI of 40.3 kg/m2. The patient's neck circumference measured 16 inches.  CURRENT MEDICATIONS: Hysec, Prilosec, Zantac   PROCEDURE:  This is a multichannel digital polysomnogram utilizing the Somnostar 11.2 system.  Electrodes and sensors were applied and monitored per AASM Specifications.   EEG, EOG, Chin and Limb EMG, were sampled at 200 Hz.  ECG, Snore and Nasal Pressure, Thermal Airflow, Respiratory Effort, CPAP Flow and Pressure, Oximetry was sampled at 50 Hz. Digital video and audio were recorded.      BASELINE STUDY  Lights Out was at 00:04 and Lights On at 07:09.  Total recording time (TRT) was 426 minutes, with a total sleep time (TST) of  331 minutes.   The patient's sleep latency was 85 minutes, which is prolonged.  REM latency was 84.5 minutes, which is normal.  The sleep efficiency was 77.7 %.     SLEEP ARCHITECTURE: WASO (Wake after sleep onset) was 34.5 minutes with mild sleep fragmentation noted.  There were 10 minutes in Stage N1, 208 minutes Stage N2, 45.5 minutes Stage N3 and 67.5 minutes in Stage REM.  The percentage of Stage N1 was 3.%, Stage N2 was 62.8%, which is mildly increased, Stage N3 was 13.7%, which is mildly reduced and Stage R (REM sleep) was 20.4%, which is normal.   Audio and video analysis did not show any abnormal or unusual movements, behaviors, phonations or vocalizations.  The patient took no bathroom breaks. Mild to  moderate snoring was noted. The EKG was in keeping with normal sinus rhythm (NSR).  RESPIRATORY ANALYSIS:  There were a total of 115 respiratory events:  43 obstructive apneas, 5 central apneas and 2 mixed apneas with a total of 50 apneas and an apnea index (AI) of 9.1 /hour. There were 65 hypopneas with a hypopnea index of 11.8 /hour. The patient also had 0 respiratory event related arousals (RERAs).      The total APNEA/HYPOPNEA INDEX (AHI) was 20.8/hour and the total RESPIRATORY DISTURBANCE INDEX was 20.8 /hour.  66 events occurred in REM sleep and 70 events in NREM. The REM AHI was 58.7 /hour, versus a non-REM AHI of 11.2. The patient spent 54 minutes of total sleep time in the supine position and 277 minutes in non-supine.. The supine AHI was 2.2 versus a non-supine AHI of 24.4.  OXYGEN SATURATION & C02:  The Wake baseline 02 saturation was 96%, with the lowest being 72%. Time spent below 89% saturation equaled 36 minutes.  PERIODIC LIMB MOVEMENTS:   The patient had a total of 116 Periodic Limb Movements.  The Periodic Limb Movement (PLM) index was 21/hour, and the PLM Arousal index was 7.1/hour.  Post-study, the patient indicated that her sleep was the same as usual.   IMPRESSION: 1. Obstructive Sleep Apnea (OSA) 2. Periodic Limb Movement Disorder (PLMD) 3. Dysfunctions associated with sleep stages or arousal from sleep  RECOMMENDATIONS: 1. This overnight polysomnogram demonstrates moderate to severe obstructive  sleep apnea with a total AHI of 20.8/hour and REM AHI of 58.7/hour as well as O2 nadir of 72% and time below 89% saturation of 36 minutes. Treatment with positive airway pressure in the form of CPAP is recommended and typically achieved during a full night CPAP titration study for proper treatment settings and mask fitting. Weight loss is recommended; an oral appliance (aka dental device, custom made by a specialized dentist usually), may be considered, but generally speaking works  for mild to moderate OSA, particularly with a significant positional component.  2. Please note that untreated obstructive sleep apnea carries additional perioperative morbidity. Patients with significant obstructive sleep apnea should receive perioperative PAP therapy and the surgeons and particularly the anesthesiologist should be informed of the diagnosis and the severity of the sleep disordered breathing. 3. The patient should be cautioned not to drive, work at heights, or operate dangerous or heavy equipment when tired or sleepy. Review and reiteration of good sleep hygiene measures should be pursued with any patient. 4. Mild PLMs (periodic limb movements of sleep) were noted during the study with mild arousals; clinical correlation is recommended. Medication effect from the patient's antidepressant medication should be considered.  5. This study shows sleep fragmentation and abnormal sleep stage percentages; these are nonspecific findings and per se do not signify an intrinsic sleep disorder or a cause for the patient's sleep-related symptoms. Causes include (but are not limited to) the first night effect of the sleep study, circadian rhythm disturbances, medication effect or an underlying mood disorder or medical problem.  6. The patient will be seen in follow-up by Dr. Rexene Alberts at [facility] for discussion of the test results and further management strategies. The referring provider will be notified of the test results.  I certify that I have reviewed the entire raw data recording prior to the issuance of this report in accordance with the Standards of Accreditation of the American Academy of Sleep Medicine (AASM)   Star Age, MD, PhD Diplomat, American Board of Psychiatry and Neurology (Neurology and Sleep Medicine)

## 2016-11-18 NOTE — Addendum Note (Signed)
Addended by: Star Age on: 11/18/2016 12:53 PM   Modules accepted: Orders

## 2016-11-18 NOTE — Progress Notes (Signed)
Patient last seen by me on 09/01/16, diagnostic PSG on 11/04/16.   Please call and notify the patient that the recent sleep study did show continued evidence of moderate to severe obstructive sleep apnea (despite UPPP and TE in 8/17) and that I recommend treatment for this in the form of CPAP. This will require a repeat sleep study for proper titration and mask fitting. Please explain to patient and arrange for a CPAP titration study. I have placed an order in the chart. Thanks, and please route to [facility] for scheduling next sleep study.   Star Age, MD, PhD  [facility])

## 2016-11-22 ENCOUNTER — Telehealth: Payer: Self-pay

## 2016-11-22 NOTE — Telephone Encounter (Signed)
I spoke to patient and she is aware of results and recommendations. She is willing to proceed with titration study. I will send copy of report to PCP.

## 2016-11-22 NOTE — Telephone Encounter (Signed)
-----   Message from Star Age, MD sent at 11/18/2016 12:53 PM EST ----- Patient last seen by me on 09/01/16, diagnostic PSG on 11/04/16.   Please call and notify the patient that the recent sleep study did show continued evidence of moderate to severe obstructive sleep apnea (despite UPPP and TE in 8/17) and that I recommend treatment for this in the form of CPAP. This will require a repeat sleep study for proper titration and mask fitting. Please explain to patient and arrange for a CPAP titration study. I have placed an order in the chart. Thanks, and please route to [facility] for scheduling next sleep study.   Star Age, MD, PhD  [facility])

## 2016-12-06 ENCOUNTER — Ambulatory Visit (INDEPENDENT_AMBULATORY_CARE_PROVIDER_SITE_OTHER): Payer: Medicare Other | Admitting: Neurology

## 2016-12-06 DIAGNOSIS — G4720 Circadian rhythm sleep disorder, unspecified type: Secondary | ICD-10-CM

## 2016-12-06 DIAGNOSIS — G4733 Obstructive sleep apnea (adult) (pediatric): Secondary | ICD-10-CM | POA: Diagnosis not present

## 2016-12-09 NOTE — Progress Notes (Signed)
Patient last seen by me on 09/01/16, diagnostic PSG on 11/04/16, CPAP study on 12/06/16.  Please call and inform patient that I have entered an order for treatment with positive airway pressure (PAP) treatment of obstructive sleep apnea (OSA). She did well during the latest sleep study with CPAP.  She will have a prior DME as she had been on CPAP, could not tolerate, then had OSA surgery with repeat testing, which confirmed mod to severe OSA.  We will arrange for a machine for home use through a DME (durable medical equipment) company of Her choice; and I will see the patient back in follow-up in about 8-10 weeks. Please also explain to the patient that I will be looking out for compliance data, which can be downloaded from the machine (stored on an SD card, that is inserted in the machine) or via remote access through a modem, that is built into the machine. At the time of the followup appointment we will discuss sleep study results and how it is going with PAP treatment at home. Please advise patient to bring Her machine at the time of the first FU visit, even though this is cumbersome. Bringing the machine for every visit after that will likely not be needed, but often helps for the first visit to troubleshoot if needed. Please re-enforce the importance of compliance with treatment and the need for [place] to monitor compliance data - often an insurance requirement and actually good feedback for the patient as far as how they are doing.  Also remind patient, that any interim PAP machine or mask issues should be first addressed with the DME company, as they can often help better with technical and mask fit issues. Please ask if patient has a preference regarding DME company.  Please also make sure, the patient has a follow-up appointment with me in about 8-10 weeks from the setup date, thanks.  Once you have spoken to the patient - and faxed/routed report to PCP and referring MD (if other than PCP), you can  close this encounter, thanks,   Star Age, MD, PhD [facility] ([place])

## 2016-12-09 NOTE — Procedures (Signed)
PATIENT'S NAME:  Megan Mcintyre, Megan Mcintyre DOB:      Mar 13, 1956      MR#:    [id number]     DATE OF RECORDING: 12/06/2016 REFERRING M.D.:  Tammy Arn Medal, MD Study Performed:   CPAP  Titration HISTORY:  60 year old woman with a history of allergies, PUD, hyperlipidemia, obesity and recurrent headaches, who has a history of severe obstructive sleep apnea with previous difficulty with CPAP therapy. The patient has undergone UPPP and tonsillectomy on 07/29/2016. Diagnostic sleep study on 11/04/16, which showed an AHI of 20.8/hour, severe in REM sleep, O2 nadir of 72%. She presents for full night CPAP titration. The patient's weight 216 pounds with a height of 61 (inches), resulting in a BMI of 40.8 kg/m2. The patient's neck circumference measured 15 inches.  CURRENT MEDICATIONS: Hysec, Prilosec, Zantac  PROCEDURE:  This is a multichannel digital polysomnogram utilizing the SomnoStar 11.2 system.  Electrodes and sensors were applied and monitored per AASM Specifications.   EEG, EOG, Chin and Limb EMG, were sampled at 200 Hz.  ECG, Snore and Nasal Pressure, Thermal Airflow, Respiratory Effort, CPAP Flow and Pressure, Oximetry was sampled at 50 Hz. Digital video and audio were recorded.      Patient was fitted with P10 small nasal pillows. CPAP was initiated at 5 cmH20 with heated humidity per AASM split night standards and pressure was advanced to 10 cmH20 because of hypopneas, apneas and desaturations.  At a PAP pressure of 10 cmH20, there was a reduction of the AHI to 0 with supine REM sleep achieved, O2 nadir of 88%.   Lights Out was at 22:52 and Lights On at 04:59. Total recording time (TRT) was 366.5 minutes, with a total sleep time (TST) of 311 minutes. The patient's sleep latency was 37.5 minutes, which is prolonged. REM latency was 8 minutes, which is reduced. The sleep efficiency was 84.9 %.    SLEEP ARCHITECTURE: WASO (Wake after sleep onset)  was 18 minutes with minimal to mild sleep fragmentation  noted.  There were 11 minutes in Stage N1, 203 minutes Stage N2, 0 minutes Stage N3 and 97 minutes in Stage REM.  The percentage of Stage N1 was 3.5%, Stage N2 was 65.3%, which is increased, Stage N3 was absent and Stage R (REM sleep) was 31.2%, which is increased (in keeping with REM rebound).   The arousals were noted as: 35 were spontaneous, 0 were associated with PLMs, 31 were associated with respiratory events.  Audio and video analysis did not show any abnormal or unusual movements, behaviors, phonations or vocalizations.  The patient took no bathroom breaks.  The EKG was in keeping with normal sinus rhythm (NSR).  RESPIRATORY ANALYSIS:  There was a total of 32 respiratory events: 0 obstructive apneas, 0 central apneas and 0 mixed apneas with a total of 0 apneas and an apnea index (AI) of 0 /hour. There were 32 hypopneas with a hypopnea index of 6.2/hour. The patient also had 0 respiratory event related arousals (RERAs).      The total APNEA/HYPOPNEA INDEX  (AHI) was 6.2 /hour and the total RESPIRATORY DISTURBANCE INDEX was 6.2 .hour  14 events occurred in REM sleep and 18 events in NREM. The REM AHI was 8.7 /hour versus a non-REM AHI of 5. /hour.  The patient spent 111 minutes of total sleep time in the supine position and 200 minutes in non-supine. The supine AHI was 10.8, versus a non-supine AHI of 3.6.  OXYGEN SATURATION & C02:  The baseline 02 saturation  was 98%, with the lowest being 78%. Time spent below 89% saturation equaled 6 minutes.  PERIODIC LIMB MOVEMENTS:    The patient had a total of 0 Periodic Limb Movements. The Periodic Limb Movement (PLM) index was 0 and the PLM Arousal index was 0 /hour. Post-study, the patient indicated that sleep was worse than usual.   DIAGNOSIS 1. Obstructive Sleep Apnea  2. Dysfunctions associated with sleep stages or arousal from sleep    PLANS/RECOMMENDATIONS: 1. This study demonstrates resolution of the patient's obstructive sleep apnea with  CPAP therapy. I will, therefore, start the patient on home CPAP treatment at a pressure of 10 cm via small nasal pillows with heated humidity. The patient should be reminded to be fully compliant with PAP therapy to improve sleep related symptoms and decrease long term cardiovascular risks. The patient should be reminded, that it may take up to 3 months to get fully used to using PAP with all planned sleep. The earlier full compliance is achieved, the better long term compliance tends to be. Please note that untreated obstructive sleep apnea carries additional perioperative morbidity. Patients with significant obstructive sleep apnea should receive perioperative PAP therapy and the surgeons and particularly the anesthesiologist should be informed of the diagnosis and the severity of the sleep disordered breathing. 2. The patient should be cautioned not to drive, work at heights, or operate dangerous or heavy equipment when tired or sleepy. Review and reiteration of good sleep hygiene measures should be pursued with any patient. 3. This study shows sleep fragmentation and abnormal sleep stage percentages; these are nonspecific findings and per se do not signify an intrinsic sleep disorder or a cause for the patient's sleep-related symptoms. Causes include (but are not limited to) the first night effect of the sleep study, circadian rhythm disturbances, medication effect or an underlying mood disorder or medical problem.  4. The patient will be seen in follow-up by Dr. Rexene Alberts at [facility] for discussion of the test results and further management strategies. The referring provider will be notified of the test results.   I certify that I have reviewed the entire raw data recording prior to the issuance of this report in accordance with the Standards of Accreditation of the American Academy of Sleep Medicine (AASM)   Star Age, MD, PhD Diplomat, ABPN for neurology and sleep

## 2016-12-09 NOTE — Addendum Note (Signed)
Addended by: Star Age on: 12/09/2016 11:59 AM   Modules accepted: Orders

## 2016-12-14 ENCOUNTER — Telehealth: Payer: Self-pay

## 2016-12-14 NOTE — Telephone Encounter (Signed)
I called pt and advised her that her sleep study results revealed that she confirmed moderate to severe sleep apnea but that she did well with cpap during the latest study and therefore Dr. Rexene Alberts recommends that she start a cpap at home. Pt says that she already has a cpap at home and it is less than five years old. She was using [facility]. Pt is agreeable to restarting cpap. I advised pt of compliance expectations (4 or more hours of use nightly). Pt is agreeable to a follow up appt on 03/08/17 at 1:00pm with Dr. Rexene Alberts. Pt verbalized understanding of results. Pt had no questions at this time but was encouraged to call back if questions arise. Will send order to [facility].

## 2016-12-14 NOTE — Telephone Encounter (Signed)
-----   Message from Star Age, MD sent at 12/09/2016 11:59 AM EST ----- Patient last seen by me on 09/01/16, diagnostic PSG on 11/04/16, CPAP study on 12/06/16.  Please call and inform patient that I have entered an order for treatment with positive airway pressure (PAP) treatment of obstructive sleep apnea (OSA). She did well during the latest sleep study with CPAP.  She will have a prior DME as she had been on CPAP, could not tolerate, then had OSA surgery with repeat testing, which confirmed mod to severe OSA.  We will arrange for a machine for home use through a DME (durable medical equipment) company of Her choice; and I will see the patient back in follow-up in about 8-10 weeks. Please also explain to the patient that I will be looking out for compliance data, which can be downloaded from the machine (stored on an SD card, that is inserted in the machine) or via remote access through a modem, that is built into the machine. At the time of the followup appointment we will discuss sleep study results and how it is going with PAP treatment at home. Please advise patient to bring Her machine at the time of the first FU visit, even though this is cumbersome. Bringing the machine for every visit after that will likely not be needed, but often helps for the first visit to troubleshoot if needed. Please re-enforce the importance of compliance with treatment and the need for [place] to monitor compliance data - often an insurance requirement and actually good feedback for the patient as far as how they are doing.  Also remind patient, that any interim PAP machine or mask issues should be first addressed with the DME company, as they can often help better with technical and mask fit issues. Please ask if patient has a preference regarding DME company.  Please also make sure, the patient has a follow-up appointment with me in about 8-10 weeks from the setup date, thanks.  Once you have spoken to the patient - and  faxed/routed report to PCP and referring MD (if other than PCP), you can close this encounter, thanks,   Star Age, MD, PhD [facility] ([place])

## 2017-03-07 ENCOUNTER — Telehealth: Payer: Self-pay

## 2017-03-07 NOTE — Telephone Encounter (Signed)
I spoke to patient about appt tomorrow. She asked to cancel appt. States that she is not on CPAP and has not been on it for a while. I offered to keep appt and discuss other treatments. Patient did not want to do that at this time and will call back if she changes her mind.

## 2017-03-08 ENCOUNTER — Ambulatory Visit: Payer: Self-pay | Admitting: Neurology

## 2017-03-30 NOTE — Telephone Encounter (Signed)
Pt called said she is ready to start CPAP. Please call to discuss at [phone]

## 2017-03-30 NOTE — Telephone Encounter (Signed)
I called pt to discuss. No answer, left a message asking her to call me back. 

## 2017-04-05 NOTE — Telephone Encounter (Signed)
I called pt again to discuss. No answer, left a message asking her to call me back. 

## 2017-04-11 NOTE — Telephone Encounter (Signed)
I called pt. I explained to her that I will resend the order for her cpap to [facility], and they should be reaching out to the pt within about 1 week. I reviewed CPAP compliance expectations with the pt. A follow up appt was made for insurance purposes for 06/26/17 at 3:00pm with the pt. Pt verbalized understanding. Pt had no questions at this time but was encouraged to call back if questions arise.

## 2017-06-22 ENCOUNTER — Telehealth: Payer: Self-pay

## 2017-06-22 NOTE — Telephone Encounter (Signed)
I called pt to discuss her cpap. No answer, left a message asking her to call me back.
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
## 2017-06-26 ENCOUNTER — Ambulatory Visit (INDEPENDENT_AMBULATORY_CARE_PROVIDER_SITE_OTHER): Payer: Medicare Other | Admitting: Neurology

## 2017-06-26 ENCOUNTER — Encounter: Payer: Self-pay | Admitting: Neurology

## 2017-06-26 VITALS — BP 109/74 | HR 72 | Ht 61.0 in | Wt 217.0 lb

## 2017-06-26 DIAGNOSIS — G4733 Obstructive sleep apnea (adult) (pediatric): Secondary | ICD-10-CM | POA: Diagnosis not present

## 2017-06-26 DIAGNOSIS — Z9889 Other specified postprocedural states: Secondary | ICD-10-CM

## 2017-06-26 NOTE — Progress Notes (Signed)
I sent referral for cpap to [facility] per pt request.
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
## 2017-06-26 NOTE — Patient Instructions (Signed)
I would like to re-start you on CPAP therapy at home by prescribing a machine for home use. I placed the order in the chart. You will need as follow up appointment in about 10 weeks post set up, that has to be scheduled; please go ahead and schedule with one of our nurse practitioners.  We will arrange for CPAP set up at home through a new DME company.  Please use your CPAP regularly. While your insurance requires that you use CPAP at least 4 hours each night on 70% of the nights, I recommend, that you not skip any nights and use it throughout the night if you can, even for scheduled naps. Getting used to CPAP and staying with the treatment long term does take time and patience and discipline. Untreated obstructive sleep apnea when it is moderate to severe can have an adverse impact on cardiovascular health and raise her risk for heart disease, arrhythmias, hypertension, congestive heart failure, stroke and diabetes. Untreated obstructive sleep apnea causes sleep disruption, nonrestorative sleep, and sleep deprivation. This can have an impact on your day to day functioning and cause daytime sleepiness and impairment of cognitive function, memory loss, mood disturbance, and problems focussing. Using CPAP regularly can improve these symptoms.

## 2017-08-09 ENCOUNTER — Telehealth: Payer: Self-pay | Admitting: Neurology
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
## 2017-08-09 NOTE — Telephone Encounter (Signed)
Left vm for patient to call back during business hrs.

## 2017-08-18 NOTE — Telephone Encounter (Signed)
Pt is asking for a call back with the name of the company for DME for her CPAP.  She is asking for a call with their name and phone # please call

## 2017-08-18 NOTE — Telephone Encounter (Signed)
pls call patient regarding DME info.

## 2017-08-22 NOTE — Telephone Encounter (Signed)
I called pt again to discuss. No answer, left a message asking her to call me back. 

## 2017-08-22 NOTE — Telephone Encounter (Signed)
I called pt to discuss, no answer, left a message asking her to call me back.  Pt's new DME is [facility], [phone]. Please relay this to pt if she calls back.

## 2017-08-23 NOTE — Telephone Encounter (Signed)
Patient called office returning RNs call.  Per Cyril Mourning I have advised patient of DME company [facility] and patient was given the phone number. FYI

## 2017-09-05 ENCOUNTER — Encounter: Payer: Self-pay | Admitting: Gastroenterology

## 2017-09-05 ENCOUNTER — Ambulatory Visit (INDEPENDENT_AMBULATORY_CARE_PROVIDER_SITE_OTHER): Payer: Medicare Other | Admitting: Gastroenterology

## 2017-09-05 ENCOUNTER — Encounter (INDEPENDENT_AMBULATORY_CARE_PROVIDER_SITE_OTHER): Payer: Self-pay

## 2017-09-05 DIAGNOSIS — Z01818 Encounter for other preprocedural examination: Secondary | ICD-10-CM | POA: Diagnosis not present

## 2017-09-05 DIAGNOSIS — Z6841 Body Mass Index (BMI) 40.0 and over, adult: Secondary | ICD-10-CM

## 2017-09-05 NOTE — Progress Notes (Signed)
Review of pertinent gastrointestinal problems: 1. Routine risk for colon cancer; colonoscopy 07/2015 Dr. Ardis Mcintyre found 1 subCM polyps which was not precancerous by pathology.  Recall recommended at 10 years.    HPI: [name]  who was referred to me by Megan Revel, MD  to evaluate  upcoming bariatric surgery .    Chief complaint is morbid obesity.  She met with a surgeon at [facility] bariatric weight [facility] center and they are considering bariatric surgery for her. That surgeon asks that she had a preoperative upper endoscopy to exclude significant gastric pathology.  She had an ulcer around [phone].  At that time she underwent EGD (outside).  She believes she was given antibiotics for H. Pylori and PPI.  Afterwards her abdominal discomforts completely improved  Lately no problems with her stomach.  No abd pains, no nausea or vomiting.    Review of systems: Pertinent positive and negative review of systems were noted in the above HPI section. All other review negative.   Past Medical History:  Diagnosis Date  . Allergy   . Depression   . High cholesterol   . Seasonal allergies   . Sleep apnea    uses c-pap    Past Surgical History:  Procedure Laterality Date  . HAND SURGERY Bilateral 2007   Carpal tunnel   . tummy tuck    . UVULOPALATOPHARYNGOPLASTY (UPPP)/TONSILLECTOMY/SEPTOPLASTY N/A 07/29/2016   Procedure: UVULOPALATOPHARYNGOPLASTY (UPPP)/TONSILLECTOMY;  Surgeon: Megan Quitter, MD;  Location: [place];  Service: ENT;  Laterality: N/A;    Current Outpatient Prescriptions  Medication Sig Dispense Refill  . cetirizine (ZYRTEC) 10 MG tablet Take 10 mg by mouth daily as needed.    Marland Kitchen omeprazole (PRILOSEC) 20 MG capsule Take 1 capsule (20 mg total) by mouth daily. 30 capsule 0  . traZODone (DESYREL) 50 MG tablet Take 50 mg by mouth at bedtime as needed.     No current facility-administered medications for this visit.     Allergies as of  09/05/2017 - Review Complete 09/05/2017  Allergen Reaction Noted  . No known allergies Other (See Comments) 07/28/2016    Family History  Problem Relation Age of Onset  . Heart disease Mother   . Stroke Father   . Diabetes Father   . Migraines Neg Hx   . Colon cancer Neg Hx   . Esophageal cancer Neg Hx   . Rectal cancer Neg Hx   . Stomach cancer Neg Hx     Social History   Social History  . Marital status: Married    Spouse name: N/A  . Number of children: 3  . Years of education: 12   Occupational History  . Unemployed    Social History Main Topics  . Smoking status: Former Research scientist (life sciences)  . Smokeless tobacco: Never Used     Comment: Quit in 2006  . Alcohol use No  . Drug use: No  . Sexual activity: Not on file   Other Topics Concern  . Not on file   Social History Narrative   Lives at home by herself.      Caffeine: none     Physical Exam: Ht 5' 1"  (1.549 m) Comment: height measured without shoes  Wt 223 lb 4 oz (101.3 kg)   BMI 42.18 kg/m  Constitutional: generally well-appearing, except for morbid obesity Psychiatric: alert and oriented x3 Eyes: extraocular movements intact Mouth: oral pharynx moist, no lesions Neck: supple no lymphadenopathy Cardiovascular: heart regular rate  and rhythm Lungs: clear to auscultation bilaterally Abdomen: soft, nontender, nondistended, no obvious ascites, no peritoneal signs, normal bowel sounds Extremities: no lower extremity edema bilaterally Skin: no lesions on visible extremities   Assessment and plan: 61 y.o. female with  Morbid obesity  She is being worked up for bariatric surgery and her bariatric surgeon asked that she have an upper endoscopy preoperatively. He wants to exclude significant pathology such as neoplasm strictures gastritis. I'm happy to do that and we will arrange for her to upper endoscopy at her soonest convenience.    Please see the "Patient Instructions" section for addition details about the  plan.   Megan Loffler, MD [facility] 09/05/2017, 1:23 PM  Cc: Megan Revel, MD

## 2017-09-05 NOTE — Patient Instructions (Addendum)
EGD pre-operative before Bariatric surgery.  Normal BMI (Body Mass Index- based on height and weight) is between 19 and 25. Your BMI today is Body mass index is 42.18 kg/m. Marland Kitchen Please consider follow up  regarding your BMI with your Primary Care Provider.

## 2017-09-08 ENCOUNTER — Ambulatory Visit (AMBULATORY_SURGERY_CENTER): Payer: Medicare Other | Admitting: Gastroenterology

## 2017-09-08 ENCOUNTER — Encounter: Payer: Self-pay | Admitting: Gastroenterology

## 2017-09-08 DIAGNOSIS — K2970 Gastritis, unspecified, without bleeding: Secondary | ICD-10-CM | POA: Diagnosis not present

## 2017-09-08 DIAGNOSIS — K2990 Gastroduodenitis, unspecified, without bleeding: Secondary | ICD-10-CM

## 2017-09-08 MED ORDER — SODIUM CHLORIDE 0.9 % IV SOLN: 500.0000 mL | INTRAVENOUS | Status: DC

## 2017-09-08 NOTE — Progress Notes (Signed)
Called to room to assist during endoscopic procedure.  Patient ID and intended procedure confirmed with present staff. Received instructions for my participation in the procedure from the performing physician.  

## 2017-09-08 NOTE — Patient Instructions (Signed)
YOU HAD AN ENDOSCOPIC PROCEDURE TODAY AT [facility]:   Refer to the procedure report that was given to you for any specific questions about what was found during the examination.  If the procedure report does not answer your questions, please call your gastroenterologist to clarify.  If you requested that your care partner not be given the details of your procedure findings, then the procedure report has been included in a sealed envelope for you to review at your convenience later.  YOU SHOULD EXPECT: Some feelings of bloating in the abdomen. Passage of more gas than usual.  Walking can help get rid of the air that was put into your GI tract during the procedure and reduce the bloating.   Please Note:  You might notice some irritation and congestion in your nose or some drainage.  This is from the oxygen used during your procedure.  There is no need for concern and it should clear up in a day or so.  SYMPTOMS TO REPORT IMMEDIATELY:    Following upper endoscopy (EGD)  Vomiting of blood or coffee ground material  New chest pain or pain under the shoulder blades  Painful or persistently difficult swallowing  New shortness of breath  Fever of 100F or higher  Black, tarry-looking stools  For urgent or emergent issues, a gastroenterologist can be reached at any hour by calling [phone].   DIET:  We do recommend a small meal at first, but then you may proceed to your regular diet.  Drink plenty of fluids but you should avoid alcoholic beverages for 24 hours.  ACTIVITY:  You should plan to take it easy for the rest of today and you should NOT DRIVE or use heavy machinery until tomorrow (because of the sedation medicines used during the test).    FOLLOW UP: Our staff will call the number listed on your records the next business day following your procedure to check on you and address any questions or concerns that you may have regarding the information given to you  following your procedure. If we do not reach you, we will leave a message.  However, if you are feeling well and you are not experiencing any problems, there is no need to return our call.  We will assume that you have returned to your regular daily activities without incident.  If any biopsies were taken you will be contacted by phone or by letter within the next 1-3 weeks.  Please call us at [phone] if you have not heard about the biopsies in 3 weeks.   SIGNATURES/CONFIDENTIALITY: You and/or your care partner have signed paperwork which will be entered into your electronic medical record.  These signatures attest to the fact that that the information above on your After Visit Summary has been reviewed and is understood.  Full responsibility of the confidentiality of this discharge information lies with you and/or your care-partner.  Await pathology  Please read over handout about gastritis  Dr. Ardis Hughs will let Dr. Raul Del know the results of your biopsies  Continue your normal medications

## 2017-09-08 NOTE — Progress Notes (Signed)
Pt's states no medical or surgical changes since previsit or office visit.Pt's states no medical or surgical changes since previsit or office visit. 

## 2017-09-08 NOTE — Op Note (Signed)
[facility] Patient Name: Natash Berman Procedure Date: 09/08/2017 7:50 AM MRN: [id number] Endoscopist: Milus Banister , MD Age: 61 Referring MD:  Date of Birth: 19-Feb-1956 Gender: Female Account #: [id number] Procedure:                Upper GI endoscopy Indications:              Personal history of peptic ulcer disease (many                            years ago); pre-operative testing requested by her                            bariatric surgeon Medicines:                Monitored Anesthesia Care Procedure:                Pre-Anesthesia Assessment:                           - Prior to the procedure, a History and Physical                            was performed, and patient medications and                            allergies were reviewed. The patient's tolerance of                            previous anesthesia was also reviewed. The risks                            and benefits of the procedure and the sedation                            options and risks were discussed with the patient.                            All questions were answered, and informed consent                            was obtained. Prior Anticoagulants: The patient has                            taken no previous anticoagulant or antiplatelet                            agents. ASA Grade Assessment: II - A patient with                            mild systemic disease. After reviewing the risks                            and benefits, the patient was deemed in  satisfactory condition to undergo the procedure.                           After obtaining informed consent, the endoscope was                            passed under direct vision. Throughout the                            procedure, the patient's blood pressure, pulse, and                            oxygen saturations were monitored continuously. The                            Endoscope was introduced through the  mouth, and                            advanced to the second part of duodenum. The upper                            GI endoscopy was accomplished without difficulty.                            The patient tolerated the procedure well. Scope In: Scope Out: Findings:                 The esophagus was normal.                           Minimal inflammation characterized by granularity                            was found in the gastric antrum. Biopsies were                            taken with a cold forceps for histology.                           The examined duodenum was normal. Complications:            No immediate complications. Estimated blood loss:                            None. Estimated Blood Loss:     Estimated blood loss: none. Impression:               - Normal esophagus.                           - Very mild gastritis, biopseid to check for H.                            pylori.                           - Normal examined duodenum.  Recommendation:           - Patient has a contact number available for                            emergencies. The signs and symptoms of potential                            delayed complications were discussed with the                            patient. Return to normal activities tomorrow.                            Written discharge instructions were provided to the                            patient.                           - Resume previous diet.                           - Continue present medications.                           - Await pathology results.                           - Copy will be sent to Dr. Demetrius Revel [facility]                            Bariatric Surgeon). Milus Banister, MD 09/08/2017 8:11:36 AM This report has been signed electronically.

## 2017-09-08 NOTE — Progress Notes (Signed)
To recovery, report to RN, VSS. 

## 2017-09-11 ENCOUNTER — Telehealth: Payer: Self-pay

## 2017-09-11 NOTE — Telephone Encounter (Signed)
Left message

## 2017-09-11 NOTE — Telephone Encounter (Signed)
Unable to leave voicemail. Tried calling both numbers on file.
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
## 2017-09-21 NOTE — Telephone Encounter (Signed)
I called pt again to discuss. No answer, left a message asking her to call me back. 
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
## 2017-09-28 ENCOUNTER — Encounter: Payer: Self-pay | Admitting: Adult Health

## 2017-09-28 ENCOUNTER — Encounter: Payer: Medicare Other | Admitting: Adult Health
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
## 2017-09-28 NOTE — Patient Instructions (Signed)
Your Plan:  Continue using CPAP nightly If your symptoms worsen or you develop new symptoms please let us know.   Thank you for coming to see us at [facility]. I hope we have been able to provide you high quality care today.  You may receive a patient satisfaction survey over the next few weeks. We would appreciate your feedback and comments so that we may continue to improve ourselves and the health of our patients.  

## 2017-11-23 ENCOUNTER — Telehealth: Payer: Self-pay | Admitting: Neurology

## 2017-11-23 NOTE — Telephone Encounter (Signed)
I called pt to discuss. I'm not sure what [facility] can offer her at this point. It is between her and [facility] as to how and if she can get a replacement cpap.  No answer, left a message asking her to call me back.
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
## 2017-11-23 NOTE — Telephone Encounter (Signed)
I have reached out to [facility] to find out what is needed.
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
## 2018-01-10 ENCOUNTER — Telehealth: Payer: Self-pay | Admitting: Neurology

## 2018-01-10 NOTE — Telephone Encounter (Signed)
Caleb(in member services) t [facility] called asking for a call back to know if a new prescription has been written for a new CPAP since they are now pt's insurer.  Please call,

## 2018-01-11 NOTE — Telephone Encounter (Signed)
I have reached out to [facility] to find out what they need.

## 2018-01-22 ENCOUNTER — Telehealth: Payer: Self-pay | Admitting: Adult Health

## 2018-01-22 NOTE — Telephone Encounter (Signed)
I have again reached out to [place]. If insurance will not pay for another cpap, pt may need to buy a machine out of pocket. [facility] is still working on this issue. [facility] would not provide her a replacement cpap machine either.

## 2018-01-22 NOTE — Telephone Encounter (Signed)
We are aware of this, please see other telephone note regarding this issue.

## 2018-01-22 NOTE — Telephone Encounter (Signed)
Pt. States they still don't have a CPAP for f/u

## 2018-01-23 ENCOUNTER — Ambulatory Visit: Payer: Medicare Other | Admitting: Adult Health

## 2018-01-23 NOTE — Telephone Encounter (Signed)
I spoke to [place] and the will need order, notes, insurance as they found that they can bill [facility] for this.

## 2018-01-23 NOTE — Telephone Encounter (Signed)
Fax confirmation received [facility] [phone].

## 2018-01-23 NOTE — Telephone Encounter (Signed)
I spoke with Dr. Rexene Alberts regarding this ongoing issue. [facility] cannot provide pt a new cpap, [facility] is researching if they are able to provide pt a cpap, and have been working on this for many weeks with no success. I will ask our sleep lab if there are any other options for her, but pt may need to pay out of pocket for a new machine, or research options thru cpap.com as well. Dr. Rexene Alberts has asked that Robin call pt to discuss.

## 2018-01-24 NOTE — Telephone Encounter (Signed)
Received a notice from [facility] that [facility] sent pt up on cpap yesterday. I called pt to get her 30-90 day appt scheduled. No answer, left a message asking her to call me back.

## 2018-01-25 NOTE — Telephone Encounter (Signed)
I called pt to discuss. No answer, left a message asking pt to call me back. 

## 2018-01-26 NOTE — Telephone Encounter (Signed)
I called pt. She has started her cpap thru [facility]. I advised her of the 30-90 day post set up follow up appt, pt is agreeable to seeing Dr. Rexene Alberts on 04/10/18 at 11:30am. Pt verbalized understanding of appt date and time.

## 2018-04-01 ENCOUNTER — Encounter: Payer: Self-pay | Admitting: Neurology

## 2018-04-09 ENCOUNTER — Telehealth: Payer: Self-pay

## 2018-04-09 NOTE — Telephone Encounter (Signed)
I called pt to discuss her cpap. Pt's cpap has not updated since 04/01/18. If pt has been using her machine, she should bring it in to the appt tomorrow so I can get an updated data report.  No answer, left a message asking her to call me back.

## 2018-04-10 ENCOUNTER — Ambulatory Visit (INDEPENDENT_AMBULATORY_CARE_PROVIDER_SITE_OTHER): Payer: Medicare Other | Admitting: Neurology

## 2018-04-10 ENCOUNTER — Encounter: Payer: Self-pay | Admitting: Neurology

## 2018-04-10 VITALS — BP 111/67 | HR 73 | Ht 61.0 in | Wt 209.0 lb

## 2018-04-10 DIAGNOSIS — Z9889 Other specified postprocedural states: Secondary | ICD-10-CM

## 2018-04-10 DIAGNOSIS — G4733 Obstructive sleep apnea (adult) (pediatric): Secondary | ICD-10-CM

## 2018-04-10 DIAGNOSIS — Z9989 Dependence on other enabling machines and devices: Secondary | ICD-10-CM

## 2018-04-10 DIAGNOSIS — Z9884 Bariatric surgery status: Secondary | ICD-10-CM

## 2018-04-10 NOTE — Patient Instructions (Signed)
Please continue using your CPAP regularly. While your insurance requires that you use CPAP at least 4 hours each night on 70% of the nights, I recommend, that you not skip any nights and use it throughout the night if you can. Getting used to CPAP and staying with the treatment long term does take time and patience and discipline. Untreated obstructive sleep apnea when it is moderate to severe can have an adverse impact on cardiovascular health and raise her risk for heart disease, arrhythmias, hypertension, congestive heart failure, stroke and diabetes. Untreated obstructive sleep apnea causes sleep disruption, nonrestorative sleep, and sleep deprivation. This can have an impact on your day to day functioning and cause daytime sleepiness and impairment of cognitive function, memory loss, mood disturbance, and problems focussing. Using CPAP regularly can improve these symptoms.  Please do the best you can with your CPAP, you did well about a month.
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
## 2018-04-10 NOTE — Telephone Encounter (Signed)
I called pt, she reports not having used her cpap in the past week. I reminded pt of her appt date and time today. Pt verbalized understanding.

## 2018-10-01 ENCOUNTER — Ambulatory Visit: Payer: Medicare Other | Admitting: Psychology

## 2018-10-11 ENCOUNTER — Telehealth: Payer: Self-pay | Admitting: Adult Health

## 2018-10-11 ENCOUNTER — Ambulatory Visit: Payer: Medicare Other | Admitting: Adult Health

## 2018-10-11 DIAGNOSIS — Z9989 Dependence on other enabling machines and devices: Principal | ICD-10-CM

## 2018-10-11 DIAGNOSIS — G4733 Obstructive sleep apnea (adult) (pediatric): Secondary | ICD-10-CM

## 2018-10-11 NOTE — Addendum Note (Signed)
Addended by: Trudie Buckler on: 10/11/2018 02:59 PM   Modules accepted: Orders

## 2018-10-11 NOTE — Telephone Encounter (Signed)
Patient called and stated that she no longer uses her CPAP and does not feel she needs to continue care.

## 2018-10-11 NOTE — Telephone Encounter (Signed)
Spoke with Megan Mcintyre she stated that since she had her Gastric bypass surgery and lost some weight that she doesn't feel the need to use her cpap machine anymore. I advised her that since she wasn't using her machine that we would send a order to DME company to suspend her machine and cancel her appointment with Jinny Blossom. She verbalized understanding.

## 2018-10-11 NOTE — Telephone Encounter (Signed)
Community message has been sent to Megan Mcintyre with [employer].

## 2019-01-23 ENCOUNTER — Ambulatory Visit (INDEPENDENT_AMBULATORY_CARE_PROVIDER_SITE_OTHER): Payer: Medicare Other | Admitting: Psychology

## 2019-01-23 DIAGNOSIS — F330 Major depressive disorder, recurrent, mild: Secondary | ICD-10-CM

## 2019-01-29 ENCOUNTER — Other Ambulatory Visit: Payer: Self-pay | Admitting: Family Medicine

## 2019-01-29 DIAGNOSIS — Z1231 Encounter for screening mammogram for malignant neoplasm of breast: Secondary | ICD-10-CM

## 2019-02-06 ENCOUNTER — Ambulatory Visit: Payer: Medicare Other | Admitting: Psychology

## 2019-02-20 ENCOUNTER — Ambulatory Visit: Payer: Medicare Other | Admitting: Psychology

## 2019-03-20 ENCOUNTER — Ambulatory Visit: Payer: Self-pay

## 2019-05-09 ENCOUNTER — Ambulatory Visit: Payer: Self-pay

## 2019-06-04 ENCOUNTER — Ambulatory Visit
Admission: RE | Admit: 2019-06-04 | Discharge: 2019-06-04 | Disposition: A | Discharge status: Home / Self Care | Payer: Medicare Other | Source: Ambulatory Visit | Attending: Family Medicine | Admitting: Family Medicine

## 2019-06-04 ENCOUNTER — Other Ambulatory Visit: Payer: Self-pay

## 2019-06-04 DIAGNOSIS — Z1231 Encounter for screening mammogram for malignant neoplasm of breast: Secondary | ICD-10-CM

## 2019-11-11 ENCOUNTER — Other Ambulatory Visit: Payer: Self-pay

## 2019-11-11 DIAGNOSIS — Z20822 Contact with and (suspected) exposure to covid-19: Secondary | ICD-10-CM

## 2019-11-13 LAB — NOVEL CORONAVIRUS, NAA: SARS-CoV-2, NAA: NOT DETECTED

## 2021-01-29 ENCOUNTER — Other Ambulatory Visit: Payer: Self-pay | Admitting: Family Medicine

## 2021-01-29 DIAGNOSIS — Z1231 Encounter for screening mammogram for malignant neoplasm of breast: Secondary | ICD-10-CM

## 2021-02-06 ENCOUNTER — Ambulatory Visit
Admission: RE | Admit: 2021-02-06 | Discharge: 2021-02-06 | Disposition: A | Discharge status: Home / Self Care | Payer: Medicare Other | Source: Ambulatory Visit | Attending: Family Medicine | Admitting: Family Medicine

## 2021-02-06 DIAGNOSIS — Z1231 Encounter for screening mammogram for malignant neoplasm of breast: Secondary | ICD-10-CM

## 2021-02-12 ENCOUNTER — Other Ambulatory Visit: Payer: Self-pay | Admitting: Family Medicine

## 2021-02-12 DIAGNOSIS — R928 Other abnormal and inconclusive findings on diagnostic imaging of breast: Secondary | ICD-10-CM

## 2021-02-25 ENCOUNTER — Other Ambulatory Visit: Payer: Self-pay

## 2021-02-25 ENCOUNTER — Ambulatory Visit
Admission: RE | Admit: 2021-02-25 | Discharge: 2021-02-25 | Disposition: A | Discharge status: Home / Self Care | Payer: Medicare Other | Source: Ambulatory Visit | Attending: Family Medicine | Admitting: Family Medicine

## 2021-02-25 DIAGNOSIS — R928 Other abnormal and inconclusive findings on diagnostic imaging of breast: Secondary | ICD-10-CM

## 2021-03-22 ENCOUNTER — Ambulatory Visit: Payer: Medicare Other

## 2021-10-06 IMAGING — US US BREAST*R* LIMITED INC AXILLA
1 series · 13 of 13 positions shown · non-contrast
Comparison: Previous exams.

CLINICAL DATA: Screening recall for right breast mass. The patient
states she did have a breast lift July 2020 and since that time
she has been experiencing a palpable area of concern in the
upper-outer right breast which her physician plans on draining.

EXAM:
DIGITAL DIAGNOSTIC UNILATERAL RIGHT MAMMOGRAM WITH TOMOSYNTHESIS AND
CAD; ULTRASOUND RIGHT BREAST LIMITED
TECHNIQUE: Right digital diagnostic mammography and breast tomosynthesis was
performed. The images were evaluated with computer-aided detection.;
Targeted ultrasound examination of the right breast was performed

[Series 1: us breast*right* limited inc axilla · 0.07mm/px · 13 of 13 slices shown]
[im 1/13]
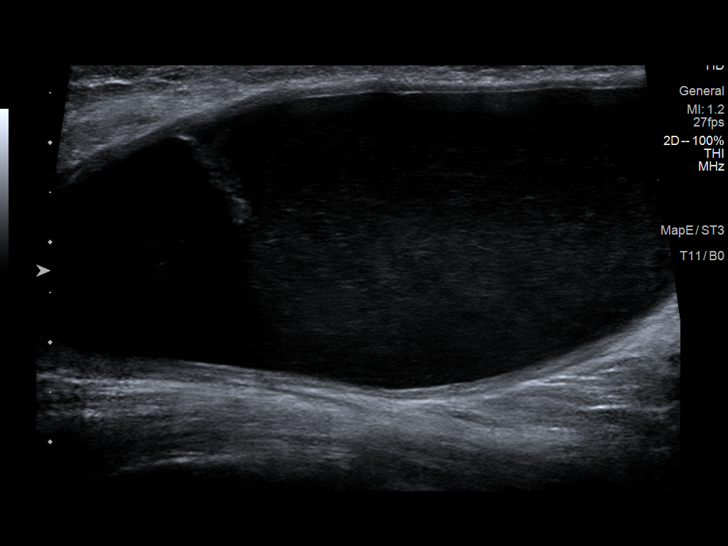
[im 2/13]
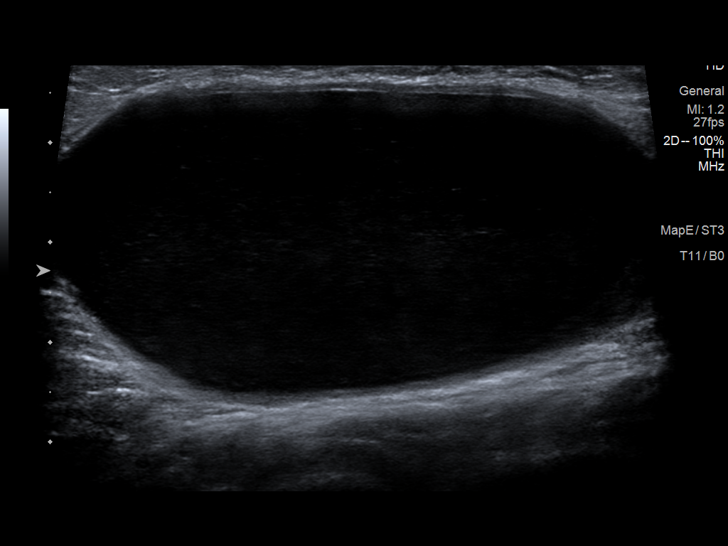
[im 3/13]
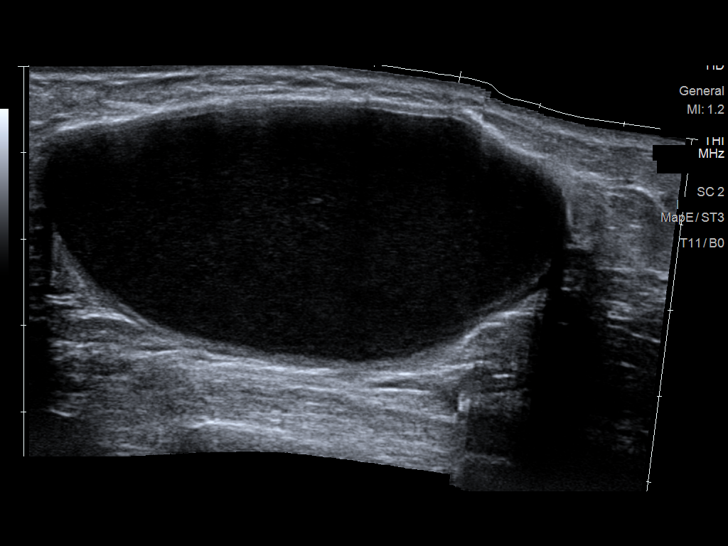
[im 4/13]
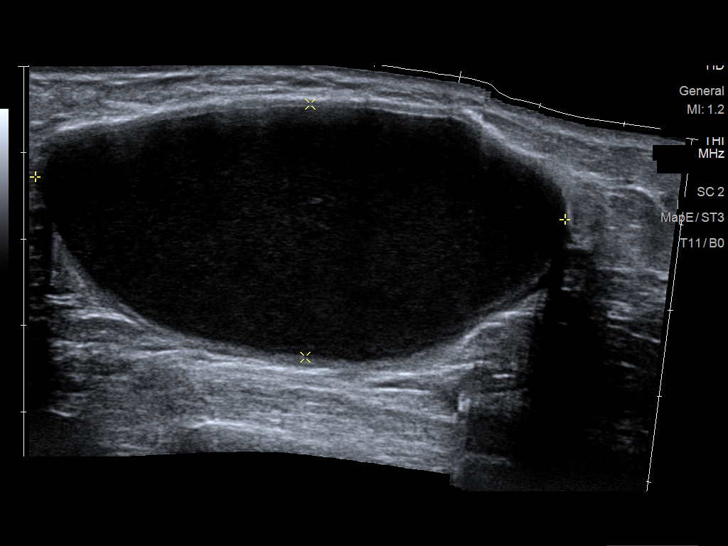
[im 5/13]
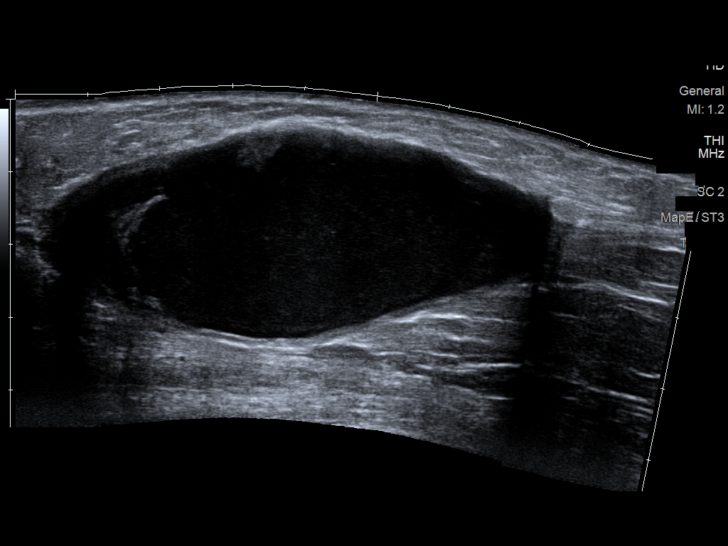
[im 6/13]
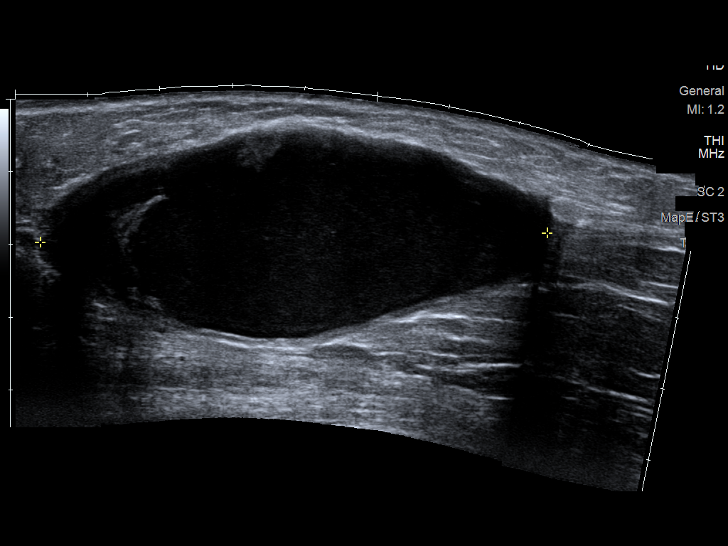
[im 7/13]
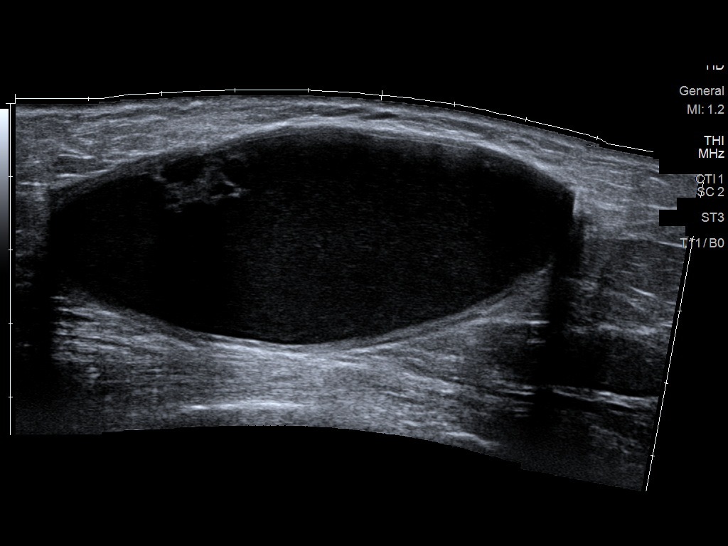
[im 8/13]
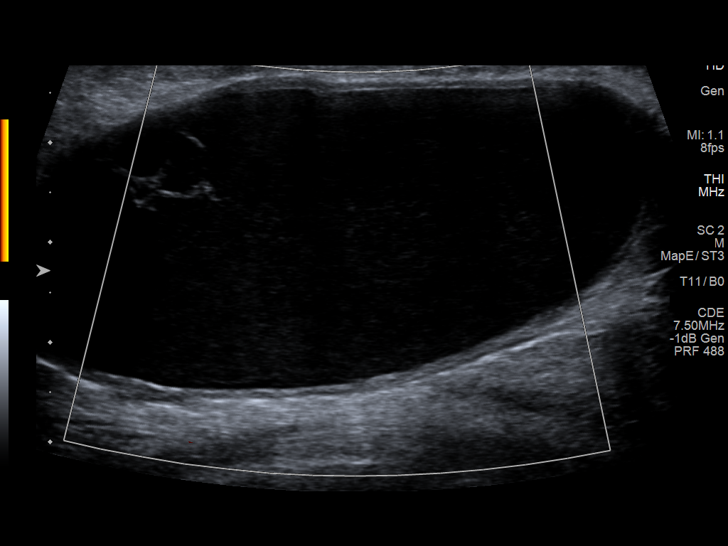
[im 9/13]
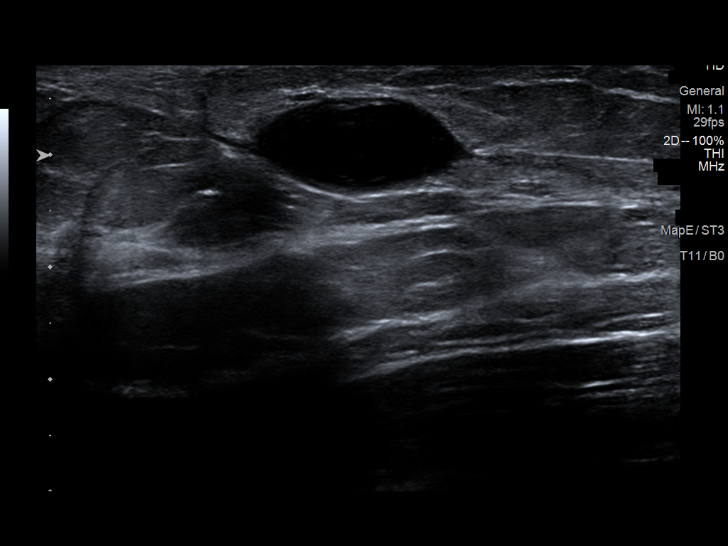
[im 10/13]
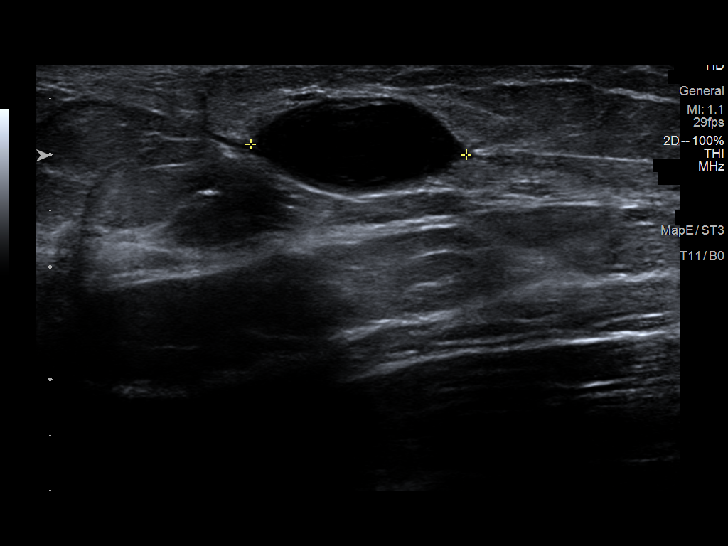
[im 11/13]
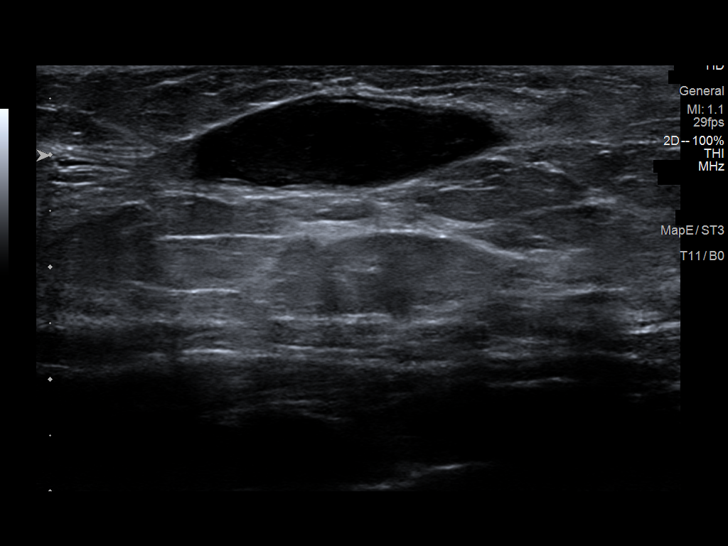
[im 12/13]
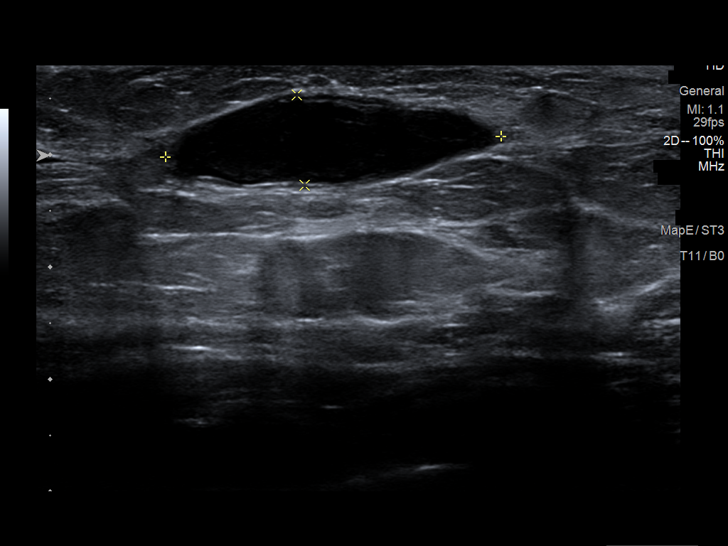
[im 13/13]
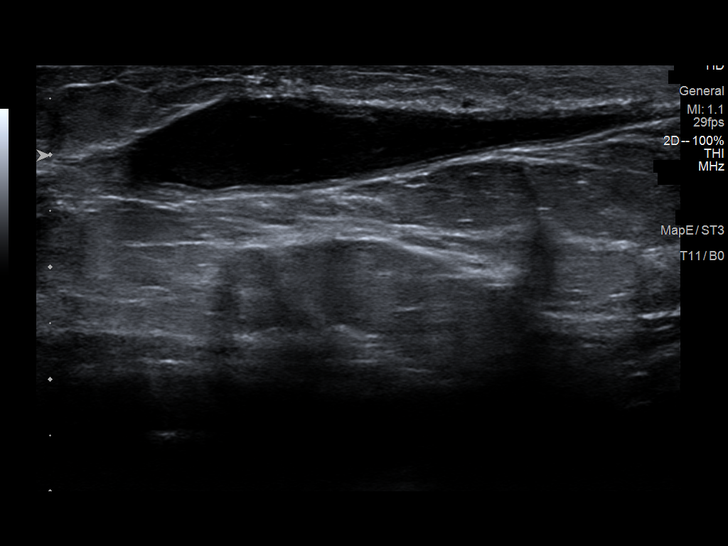

[13 of 13 positions shown; findings below may reference images not displayed]

ACR Breast Density Category b: There are scattered areas of
fibroglandular density.
FINDINGS: Additional tomograms were performed of the right breast. There is an
approximate 6 cm oval mass with slight margin irregularity within
the outer right breast. The additional initially questioned mass in
the anterior central right breast seen on the cc view resolves on
the additional imaging with findings compatible with an area of
overlapping fibroglandular tissue.

Physical examination of the outer right breast reveals a soft
fluctuant mass at the approximate 3 o'clock position.

Targeted ultrasound of the outer right breast was performed. There
is a large fluid collection at the approximate 9 o'clock position 10
cm from nipple compatible with a seroma measuring approximately 7 x
2.9 x 6.1 cm. An additional smaller collection at 3 o'clock 4 cm
from nipple measures 1.9 x 0.8 x 3 cm.
IMPRESSION: Postoperative seroma in the outer right breast measuring up to 7 cm
and an additional smaller seroma in the inner right breast measuring
3 cm. No findings of malignancy in the right breast.

RECOMMENDATION:
Screening mammogram in one year.(Code:XG-4-SL3)

I have discussed the findings and recommendations with the patient.
If applicable, a reminder letter will be sent to the patient
regarding the next appointment.

BI-RADS CATEGORY  2: Benign.

## 2022-07-18 ENCOUNTER — Encounter (HOSPITAL_COMMUNITY): Payer: Self-pay

## 2022-07-18 ENCOUNTER — Ambulatory Visit (HOSPITAL_COMMUNITY)
Admission: EM | Admit: 2022-07-18 | Discharge: 2022-07-18 | Disposition: A | Discharge status: Home / Self Care | Payer: Medicare Other | Attending: Internal Medicine | Admitting: Internal Medicine

## 2022-07-18 DIAGNOSIS — M545 Low back pain, unspecified: Secondary | ICD-10-CM | POA: Diagnosis not present

## 2022-07-18 DIAGNOSIS — W19XXXA Unspecified fall, initial encounter: Secondary | ICD-10-CM | POA: Diagnosis not present

## 2022-07-18 MED ORDER — ACETAMINOPHEN 325 MG PO TABS
ORAL_TABLET | ORAL | Status: AC
  Filled 2022-07-18: qty 3

## 2022-07-18 MED ORDER — ACETAMINOPHEN 325 MG PO TABS
975.0000 mg | ORAL_TABLET | Freq: Once | ORAL | Status: AC
  Administered 2022-07-18: 975 mg via ORAL

## 2022-07-18 MED ORDER — ACETAMINOPHEN 500 MG PO TABS: 1000.0000 mg | ORAL_TABLET | Freq: Four times a day (QID) | ORAL | 0 refills | Status: AC | PRN

## 2022-07-18 MED ORDER — KETOROLAC TROMETHAMINE 30 MG/ML IJ SOLN
INTRAMUSCULAR | Status: AC
  Filled 2022-07-18: qty 1

## 2022-07-18 MED ORDER — KETOROLAC TROMETHAMINE 30 MG/ML IJ SOLN
30.0000 mg | Freq: Once | INTRAMUSCULAR | Status: AC
  Administered 2022-07-18: 30 mg via INTRAMUSCULAR

## 2022-07-18 NOTE — ED Triage Notes (Signed)
Patient fell yesterday at 4:28 pm. Patient slipped on water at [facility]. Patient states today she is having pain in the back, knees, right elbow, and bruising to the buttocks and right thigh.

## 2022-07-18 NOTE — ED Provider Notes (Addendum)
[place]    CSN: [id number] Arrival date & time: 07/18/22  1402      History   Chief Complaint Chief Complaint  Patient presents with   Fall    HPI Megan Mcintyre is a 66 y.o. female.   Patient presents urgent care for evaluation of her lower back and right knee pain after falling while she was at [facility] yesterday around 3:30 PM.  Patient states that she was attempting to grab some cilantro off of the shelf at [employer], stepped and pivoted, then fell landing onto her buttocks.  She did not hit her head and she states that her body spread out on the floor "like an umbrella".  She is unsure of exactly how she fell but states that there was some water on the floor and she believes that she slipped on the water prior to falling.  She denies dizziness, lightheadedness, headache, feeling ill, and nausea prior to falling. She is not on blood thinning medications.  Patient was able to get up off of the floor without much assistance by other customers.  She began to notice that she was very sore when she was checking out at the grocery store.  Her daughter-in-law wanted her to call 911 and be transported to the hospital, but patient states "I did not want to sit at the ER all day when I did not break anything".  Patient states that she has not taken anything for her pain over-the-counter prior to arrival urgent care.  She avoids taking medications and states that she had a gastric bypass surgical procedure and "lots of problems with her stomach". Pain to the low back/bilateral buttock area and the right knee worsened overnight and into this morning prompting her to come to urgent care for evaluation. No urinary or bowel incontinence, history of seizures, or loss of consciousness reported.   Fall    Past Medical History:  Diagnosis Date   Allergy    Depression    High cholesterol    Seasonal allergies    Sleep apnea    uses c-pap    Patient Active Problem List    Diagnosis Date Noted   Sleep apnea, obstructive 07/29/2016   Excessive daytime sleepiness 04/15/2015   Snoring 04/15/2015   Witnessed apneic spells 04/15/2015   Obesity hypoventilation syndrome ([place]) 04/15/2015   Uncontrolled morning headache 04/15/2015   Morbid obesity ([place]) 04/15/2015   Worsening headaches 04/15/2015    Past Surgical History:  Procedure Laterality Date   HAND SURGERY Bilateral 2007   Carpal tunnel    REDUCTION MAMMAPLASTY     breast lift 07/2020   tummy tuck     UVULOPALATOPHARYNGOPLASTY (UPPP)/TONSILLECTOMY/SEPTOPLASTY N/A 07/29/2016   Procedure: UVULOPALATOPHARYNGOPLASTY (UPPP)/TONSILLECTOMY;  Surgeon: Melida Quitter, MD;  Location: [place];  Service: ENT;  Laterality: N/A;    OB History   No obstetric history on file.      Home Medications    Prior to Admission medications   Medication Sig Start Date End Date Taking? Authorizing Provider  acetaminophen (TYLENOL) 500 MG tablet Take 2 tablets (1,000 mg total) by mouth every 6 (six) hours as needed. 07/18/22  Yes Talbot Grumbling, FNP  omeprazole (PRILOSEC) 20 MG capsule Take 1 capsule (20 mg total) by mouth daily. Patient taking differently: Take 40 mg by mouth daily. 06/10/16  Yes Horton, Barbette Hair, MD  cetirizine (ZYRTEC) 10 MG tablet Take 10 mg by mouth daily as needed. 04/04/17 04/04/18  [provider]  traZODone (  DESYREL) 50 MG tablet Take 50 mg by mouth at bedtime as needed. 02/01/17   [provider]    Family History Family History  Problem Relation Age of Onset   Heart disease Mother    Stroke Father    Diabetes Father    Migraines Neg Hx    Colon cancer Neg Hx    Esophageal cancer Neg Hx    Rectal cancer Neg Hx    Stomach cancer Neg Hx     Social History Social History   Tobacco Use   Smoking status: Former   Smokeless tobacco: Never   Tobacco comments:    Quit in 2006  Substance Use Topics   Alcohol use: No    Alcohol/week: 0.0 standard drinks of alcohol    Drug use: No     Allergies   No known allergies   Review of Systems Review of Systems Per HPI  Physical Exam Triage Vital Signs ED Triage Vitals  Enc Vitals Group     BP 07/18/22 1536 (!) 125/54     Pulse Rate 07/18/22 1536 (!) 58     Resp 07/18/22 1536 16     Temp 07/18/22 1536 98.1 F (36.7 C)     Temp Source 07/18/22 1536 Oral     SpO2 07/18/22 1536 100 %     Weight 07/18/22 1535 165 lb (74.8 kg)     Height 07/18/22 1535 '5\' 1"'$  (1.549 m)     Head Circumference --      Peak Flow --      Pain Score 07/18/22 1535 5     Pain Loc --      Pain Edu? --      Excl. in [place]? --    No data found.  Updated Vital Signs BP (!) 125/54 (BP Location: Left Arm)   Pulse (!) 58   Temp 98.1 F (36.7 C) (Oral)   Resp 16   Ht '5\' 1"'$  (1.549 m)   Wt 165 lb (74.8 kg)   SpO2 100%   BMI 31.18 kg/m   Visual Acuity Right Eye Distance:   Left Eye Distance:   Bilateral Distance:    Right Eye Near:   Left Eye Near:    Bilateral Near:     Physical Exam Vitals and nursing note reviewed.  Constitutional:      Appearance: Normal appearance. She is not ill-appearing or toxic-appearing.     Comments: Very pleasant patient sitting on exam in position of comfort table in no acute distress.   HENT:     Head: Normocephalic and atraumatic.     Right Ear: Hearing and external ear normal.     Left Ear: Hearing and external ear normal.     Nose: Nose normal.     Mouth/Throat:     Lips: Pink.     Mouth: Mucous membranes are moist.  Eyes:     General: Lids are normal. Vision grossly intact. Gaze aligned appropriately.     Extraocular Movements: Extraocular movements intact.     Conjunctiva/sclera: Conjunctivae normal.  Cardiovascular:     Rate and Rhythm: Normal rate and regular rhythm.     Heart sounds: Normal heart sounds, S1 normal and S2 normal.  Pulmonary:     Effort: Pulmonary effort is normal. No respiratory distress.     Breath sounds: Normal breath sounds and air entry.   Abdominal:     Palpations: Abdomen is soft.  Musculoskeletal:     Cervical back: Neck supple.  Right lower leg: No edema.     Left lower leg: No edema.     Comments: No pain with palpation to the cervical, thoracic, or lumbar spine.  Small amount of sacroiliac tenderness with palpation.  No tenderness with palpation of bilateral sciatic nerve joints.  Patient's range of motion is normal.  She is able to recreate how she fell with similar motions in the visit without difficulty or pain.  Right knee is slightly swollen as compared to the left.  No laxity to the bilateral knees or pain with palpation.  Patient is able to ambulate with a steady gait without assistance without difficulty.  Skin:    General: Skin is warm and dry.     Capillary Refill: Capillary refill takes less than 2 seconds.     Findings: No rash.  Neurological:     General: No focal deficit present.     Mental Status: She is alert and oriented to person, place, and time. Mental status is at baseline.     Cranial Nerves: No dysarthria or facial asymmetry.     Motor: No weakness.     Gait: Gait is intact. Gait normal.  Psychiatric:        Mood and Affect: Mood normal.        Speech: Speech normal.        Behavior: Behavior normal.        Thought Content: Thought content normal.        Judgment: Judgment normal.      UC Treatments / Results  Labs (all labs ordered are listed, but only abnormal results are displayed) Labs Reviewed - No data to display  EKG   Radiology No results found.  Procedures Procedures (including critical care time)  Medications Ordered in UC Medications  ketorolac (TORADOL) 30 MG/ML injection 30 mg (has no administration in time range)  acetaminophen (TYLENOL) tablet 975 mg (975 mg Oral Given 07/18/22 1657)    Initial Impression / Assessment and Plan / UC Course  I have reviewed the triage vital signs and the nursing notes.  Pertinent labs & imaging results that were available  during my care of the patient were reviewed by me and considered in my medical decision making (see chart for details).  1.  Fall Patient's symptoms are consistent with muscular strain.  Patient given ketorolac injection 30 mg in clinic for inflammation and pain.  She may take Tylenol 1000 mg every 6 hours at home as needed for pain and inflammation.  She may also take Zanaflex muscle relaxer every 6 hours as needed for muscle spasm to the low back.  Heat and gentle range of motion exercises advised.  Ice to the right knee advised and elevation to reduce swelling.  No clinical indication for imaging at this time due to stable musculoskeletal exam and stable gait.  Advised patient not to take Zanaflex muscle relaxer and drive or drink alcohol as this can make her sleepy.  Kidney function normal as of labs in 2021 at [place]. Patient agreeable to this plan.   Discussed physical exam and available lab work findings in clinic with patient.  Counseled patient regarding appropriate use of medications and potential side effects for all medications recommended or prescribed today. Discussed red flag signs and symptoms of worsening condition,when to call the PCP office, return to urgent care, and when to seek higher level of care in the emergency department. Patient verbalizes understanding and agreement with plan. All questions answered. Patient discharged  in stable condition.  Final Clinical Impressions(s) / UC Diagnoses   Final diagnoses:  Fall, initial encounter  Acute bilateral low back pain without sciatica     Discharge Instructions      You have been evaluated in the today for your back and right knee pain. Your pain is most likely muscle strain which will improve on its own with time.   You were given an injection of ketorolac in the clinic today to help reduce inflammation and pain.   Your first dose of tylenol when you get home may be at 11pm since you were given tylenol in the clinic  today.  You may also take tizanidine muscle relaxer. Do not take this medication and drive or drink alcohol as it can make you sleepy.  Mainly use this medicine at nighttime as needed.  Apply heat and perform gentle range of motion exercises to the area of greatest pain to prevent muscle stiffness and provide further pain relief.  Apply ice to your right knee to reduce the swelling and elevate/rest the right knee.   Follow-up with your primary care provider or return to urgent care if your symptoms do not improve in the next 3 to 4 days with medications and interventions recommended today.  If you develop any new or worsening symptoms, please return to urgent care.  If your symptoms are severe, please go to the emergency room.  I hope you feel better!    ED Prescriptions     Medication Sig Dispense Auth. Provider   acetaminophen (TYLENOL) 500 MG tablet Take 2 tablets (1,000 mg total) by mouth every 6 (six) hours as needed. 30 tablet Talbot Grumbling, FNP      PDMP not reviewed this encounter.   Talbot Grumbling, FNP 07/18/22 [place], [place], FNP 07/18/22 1721

## 2022-07-18 NOTE — Discharge Instructions (Addendum)
You have been evaluated in the today for your back and right knee pain. Your pain is most likely muscle strain which will improve on its own with time.   You were given an injection of ketorolac in the clinic today to help reduce inflammation and pain.   Your first dose of tylenol when you get home may be at 11pm since you were given tylenol in the clinic today.  You may also take tizanidine muscle relaxer. Do not take this medication and drive or drink alcohol as it can make you sleepy.  Mainly use this medicine at nighttime as needed.  Apply heat and perform gentle range of motion exercises to the area of greatest pain to prevent muscle stiffness and provide further pain relief.  Apply ice to your right knee to reduce the swelling and elevate/rest the right knee.   Follow-up with your primary care provider or return to urgent care if your symptoms do not improve in the next 3 to 4 days with medications and interventions recommended today.  If you develop any new or worsening symptoms, please return to urgent care.  If your symptoms are severe, please go to the emergency room.  I hope you feel better!

## 2023-12-05 ENCOUNTER — Ambulatory Visit (HOSPITAL_COMMUNITY)
Admission: EM | Admit: 2023-12-05 | Discharge: 2023-12-05 | Disposition: A | Discharge status: Home / Self Care | Payer: 59 | Attending: Family Medicine | Admitting: Family Medicine

## 2023-12-05 ENCOUNTER — Encounter (HOSPITAL_COMMUNITY): Payer: Self-pay

## 2023-12-05 ENCOUNTER — Ambulatory Visit (INDEPENDENT_AMBULATORY_CARE_PROVIDER_SITE_OTHER): Payer: 59

## 2023-12-05 VITALS — BP 114/74 | HR 68 | Temp 97.9°F | Resp 16 | Ht 61.0 in | Wt 169.0 lb

## 2023-12-05 DIAGNOSIS — M79642 Pain in left hand: Secondary | ICD-10-CM | POA: Diagnosis not present

## 2023-12-05 DIAGNOSIS — M25561 Pain in right knee: Secondary | ICD-10-CM

## 2023-12-05 MED ORDER — TRAMADOL HCL 50 MG PO TABS: 50.0000 mg | ORAL_TABLET | Freq: Four times a day (QID) | ORAL | 0 refills | Status: AC | PRN

## 2023-12-05 MED ORDER — KETOROLAC TROMETHAMINE 30 MG/ML IJ SOLN
30.0000 mg | Freq: Once | INTRAMUSCULAR | Status: AC
  Administered 2023-12-05: 30 mg via INTRAMUSCULAR

## 2023-12-05 MED ORDER — KETOROLAC TROMETHAMINE 30 MG/ML IJ SOLN
INTRAMUSCULAR | Status: AC
  Filled 2023-12-05: qty 1

## 2023-12-05 NOTE — ED Triage Notes (Signed)
Patient here today with c/o left hand pain with swelling and right knee pain after falling on Sunday afternoon. Patient states that she tripped over a broom and fell forward.

## 2023-12-05 NOTE — Discharge Instructions (Signed)
By my review I cannot see any broken bones on your x-rays.  There is a lot of arthritis in your knee.   The radiologist will also read your x-ray, and if their interpretation differs significantly from mine, we will call you.  You have been given a shot of Toradol 30 mg today.  Take tramadol 50 mg-- 1 tablet every 6 hours as needed for pain.  This medication can make you sleepy or dizzy  Ice and rest and elevate where you are hurting today.

## 2023-12-05 NOTE — ED Provider Notes (Signed)
[facility]    CSN: [id number] Arrival date & time: 12/05/23  1001      History   Chief Complaint Chief Complaint  Patient presents with   Fall    HPI Megan Mcintyre is a 67 y.o. female.    Fall  Here for pain in her left hand and her right knee on December 15 she tripped over a broom and fell forward.  She is now having a little pain in the lower anterior pole of her right knee and feels swollen.  She also has pain and swelling in the dorsum of her left hand.  She can move her fingers but it is hard to completely close in a fist.  She has had a history of stomach problems and cannot take oral anti-inflammatories.  Past Medical History:  Diagnosis Date   Allergy    Depression    High cholesterol    Seasonal allergies    Sleep apnea    uses c-pap    Patient Active Problem List   Diagnosis Date Noted   Sleep apnea, obstructive 07/29/2016   Excessive daytime sleepiness 04/15/2015   Snoring 04/15/2015   Witnessed apneic spells 04/15/2015   Obesity hypoventilation syndrome ([facility]) 04/15/2015   Uncontrolled morning headache 04/15/2015   Morbid obesity ([facility]) 04/15/2015   Worsening headaches 04/15/2015    Past Surgical History:  Procedure Laterality Date   HAND SURGERY Bilateral 2007   Carpal tunnel    REDUCTION MAMMAPLASTY     breast lift 07/2020   tummy tuck     UVULOPALATOPHARYNGOPLASTY (UPPP)/TONSILLECTOMY/SEPTOPLASTY N/A 07/29/2016   Procedure: UVULOPALATOPHARYNGOPLASTY (UPPP)/TONSILLECTOMY;  Surgeon: Christia Reading, MD;  Location: [facility] OR;  Service: ENT;  Laterality: N/A;    OB History   No obstetric history on file.      Home Medications    Prior to Admission medications   Medication Sig Start Date End Date Taking? Authorizing Provider  traMADol (ULTRAM) 50 MG tablet Take 1 tablet (50 mg total) by mouth every 6 (six) hours as needed (pain). 12/05/23  Yes Zenia Resides, MD  acetaminophen (TYLENOL) 500 MG tablet Take 2 tablets (1,000 mg  total) by mouth every 6 (six) hours as needed. 07/18/22   Carlisle Beers, FNP  buPROPion (WELLBUTRIN XL) 150 MG 24 hr tablet Take 150 mg by mouth every morning.    [provider]  cetirizine (ZYRTEC) 10 MG tablet Take 10 mg by mouth daily as needed. 04/04/17 04/04/18  [provider]  omeprazole (PRILOSEC) 20 MG capsule Take 1 capsule (20 mg total) by mouth daily. Patient taking differently: Take 40 mg by mouth daily. 06/10/16   Horton, Mayer Masker, MD  traZODone (DESYREL) 50 MG tablet Take 50 mg by mouth at bedtime as needed. 02/01/17   [provider]    Family History Family History  Problem Relation Age of Onset   Heart disease Mother    Stroke Father    Diabetes Father    Migraines Neg Hx    Colon cancer Neg Hx    Esophageal cancer Neg Hx    Rectal cancer Neg Hx    Stomach cancer Neg Hx     Social History Social History   Tobacco Use   Smoking status: Former   Smokeless tobacco: Never   Tobacco comments:    Quit in 2006  Vaping Use   Vaping status: Never Used  Substance Use Topics   Alcohol use: Yes    Comment: occasionally   Drug use:  No     Allergies   No known allergies   Review of Systems Review of Systems   Physical Exam Triage Vital Signs ED Triage Vitals  Encounter Vitals Group     BP 12/05/23 1016 114/74     Systolic BP Percentile --      Diastolic BP Percentile --      Pulse Rate 12/05/23 1016 68     Resp 12/05/23 1016 16     Temp 12/05/23 1016 97.9 F (36.6 C)     Temp Source 12/05/23 1016 Oral     SpO2 12/05/23 1016 96 %     Weight 12/05/23 1016 169 lb (76.7 kg)     Height 12/05/23 1016 5\' 1"  (1.549 m)     Head Circumference --      Peak Flow --      Pain Score 12/05/23 1014 4     Pain Loc --      Pain Education --      Exclude from Growth Chart --    No data found.  Updated Vital Signs BP 114/74 (BP Location: Right Arm)   Pulse 68   Temp 97.9 F (36.6 C) (Oral)   Resp 16   Ht 5\' 1"  (1.549 m)    Wt 76.7 kg   SpO2 96%   BMI 31.93 kg/m   Visual Acuity Right Eye Distance:   Left Eye Distance:   Bilateral Distance:    Right Eye Near:   Left Eye Near:    Bilateral Near:     Physical Exam Vitals reviewed.  Constitutional:      General: She is not in acute distress.    Appearance: She is not ill-appearing, toxic-appearing or diaphoretic.  Musculoskeletal:     Comments: There is swelling and ecchymosis and tenderness over the dorsum of her left hand.  There is no deformity.  It is tender over the whole dorsum of her hand.  Capillary refill is normal distally . Also she has some mild tenderness in the lower pole of her anterior right knee. Range of motion is normal; I cannot discern an effusion  Skin:    Coloration: Skin is not jaundiced or pale.  Neurological:     Mental Status: She is alert.      UC Treatments / Results  Labs (all labs ordered are listed, but only abnormal results are displayed) Labs Reviewed - No data to display  EKG   Radiology No results found.  Procedures Procedures (including critical care time)  Medications Ordered in UC Medications  ketorolac (TORADOL) 30 MG/ML injection 30 mg (has no administration in time range)    Initial Impression / Assessment and Plan / UC Course  I have reviewed the triage vital signs and the nursing notes.  Pertinent labs & imaging results that were available during my care of the patient were reviewed by me and considered in my medical decision making (see chart for details).     By my review there are no fractures on either sets of x-rays.  There is a good bit of degenerative joint change in the right knee x-ray.  Last EGFR this year in care everywhere was greater than 90  She is advised of radiology over read.  Toradol is given here and tramadol is sent in for pain to the pharmacy.  Ice and elevation are recommended  Knee sleeve brace is supplied as is an Ace wrap, for hand Final Clinical  Impressions(s) / UC Diagnoses  Final diagnoses:  Left hand pain  Acute pain of right knee     Discharge Instructions      By my review I cannot see any broken bones on your x-rays.  There is a lot of arthritis in your knee.   The radiologist will also read your x-ray, and if their interpretation differs significantly from mine, we will call you.  You have been given a shot of Toradol 30 mg today.  Take tramadol 50 mg-- 1 tablet every 6 hours as needed for pain.  This medication can make you sleepy or dizzy  Ice and rest and elevate where you are hurting today.     ED Prescriptions     Medication Sig Dispense Auth. Provider   traMADol (ULTRAM) 50 MG tablet Take 1 tablet (50 mg total) by mouth every 6 (six) hours as needed (pain). 12 tablet Bernard Donahoo, Janace Aris, MD      I have reviewed the PDMP during this encounter.   Zenia Resides, MD 12/05/23 [phone]
